# Patient Record
Sex: Female | Born: 1950
Health system: Southern US, Community
[De-identification: ages and names within clinical notes are randomized; demographics above are authoritative.]

## PROBLEM LIST (undated history)

## (undated) DIAGNOSIS — F0390 Unspecified dementia without behavioral disturbance: Secondary | ICD-10-CM

## (undated) DIAGNOSIS — E7800 Pure hypercholesterolemia, unspecified: Secondary | ICD-10-CM

## (undated) DIAGNOSIS — M62838 Other muscle spasm: Secondary | ICD-10-CM

## (undated) DIAGNOSIS — R001 Bradycardia, unspecified: Secondary | ICD-10-CM

## (undated) DIAGNOSIS — F988 Other specified behavioral and emotional disorders with onset usually occurring in childhood and adolescence: Secondary | ICD-10-CM

## (undated) DIAGNOSIS — R413 Other amnesia: Secondary | ICD-10-CM

## (undated) DIAGNOSIS — F324 Major depressive disorder, single episode, in partial remission: Secondary | ICD-10-CM

## (undated) HISTORY — DX: Pure hypercholesterolemia, unspecified: E78.00

## (undated) HISTORY — PX: SHOULDER ARTHROSCOPY: SHX128

## (undated) HISTORY — DX: Major depressive disorder, single episode, in partial remission: F32.4

## (undated) HISTORY — DX: Other amnesia: R41.3

## (undated) HISTORY — PX: CHOLECYSTECTOMY: SHX55

## (undated) HISTORY — PX: FOOT SURGERY: SHX648

## (undated) HISTORY — DX: Other specified behavioral and emotional disorders with onset usually occurring in childhood and adolescence: F98.8

## (undated) HISTORY — DX: Other muscle spasm: M62.838

---

## 1998-02-22 ENCOUNTER — Ambulatory Visit (HOSPITAL_COMMUNITY): Admission: RE | Admit: 1998-02-22 | Discharge: 1998-02-22 | Payer: Self-pay | Admitting: Gastroenterology

## 1998-02-22 ENCOUNTER — Encounter: Payer: Self-pay | Admitting: Gastroenterology

## 1998-02-28 ENCOUNTER — Encounter (HOSPITAL_BASED_OUTPATIENT_CLINIC_OR_DEPARTMENT_OTHER): Payer: Self-pay | Admitting: General Surgery

## 1998-03-01 ENCOUNTER — Encounter (HOSPITAL_BASED_OUTPATIENT_CLINIC_OR_DEPARTMENT_OTHER): Payer: Self-pay | Admitting: General Surgery

## 1998-03-01 ENCOUNTER — Ambulatory Visit (HOSPITAL_COMMUNITY): Admission: RE | Admit: 1998-03-01 | Discharge: 1998-03-02 | Payer: Self-pay | Admitting: General Surgery

## 1998-09-04 ENCOUNTER — Other Ambulatory Visit: Admission: RE | Admit: 1998-09-04 | Discharge: 1998-09-04 | Payer: Self-pay | Admitting: Obstetrics and Gynecology

## 1998-10-16 ENCOUNTER — Other Ambulatory Visit: Admission: RE | Admit: 1998-10-16 | Discharge: 1998-10-16 | Payer: Self-pay | Admitting: Obstetrics and Gynecology

## 1999-01-21 ENCOUNTER — Other Ambulatory Visit: Admission: RE | Admit: 1999-01-21 | Discharge: 1999-01-21 | Payer: Self-pay | Admitting: Obstetrics and Gynecology

## 1999-07-30 ENCOUNTER — Ambulatory Visit (HOSPITAL_BASED_OUTPATIENT_CLINIC_OR_DEPARTMENT_OTHER): Admission: RE | Admit: 1999-07-30 | Discharge: 1999-07-30 | Payer: Self-pay | Admitting: Orthopedic Surgery

## 2000-03-31 ENCOUNTER — Ambulatory Visit (HOSPITAL_COMMUNITY): Admission: RE | Admit: 2000-03-31 | Discharge: 2000-03-31 | Payer: Self-pay | Admitting: Obstetrics and Gynecology

## 2000-03-31 ENCOUNTER — Encounter: Payer: Self-pay | Admitting: Obstetrics and Gynecology

## 2000-08-26 ENCOUNTER — Other Ambulatory Visit: Admission: RE | Admit: 2000-08-26 | Discharge: 2000-08-26 | Payer: Self-pay | Admitting: Obstetrics and Gynecology

## 2001-10-18 ENCOUNTER — Encounter: Payer: Self-pay | Admitting: Otolaryngology

## 2001-10-18 ENCOUNTER — Encounter: Admission: RE | Admit: 2001-10-18 | Discharge: 2001-10-18 | Payer: Self-pay | Admitting: Otolaryngology

## 2001-10-28 ENCOUNTER — Other Ambulatory Visit: Admission: RE | Admit: 2001-10-28 | Discharge: 2001-10-28 | Payer: Self-pay | Admitting: Obstetrics and Gynecology

## 2002-05-15 ENCOUNTER — Ambulatory Visit (HOSPITAL_COMMUNITY): Admission: RE | Admit: 2002-05-15 | Discharge: 2002-05-15 | Payer: Self-pay | Admitting: Obstetrics and Gynecology

## 2002-05-15 ENCOUNTER — Encounter: Payer: Self-pay | Admitting: Obstetrics and Gynecology

## 2002-09-26 ENCOUNTER — Ambulatory Visit (HOSPITAL_COMMUNITY): Admission: RE | Admit: 2002-09-26 | Discharge: 2002-09-26 | Payer: Self-pay | Admitting: Gastroenterology

## 2003-06-21 ENCOUNTER — Other Ambulatory Visit: Admission: RE | Admit: 2003-06-21 | Discharge: 2003-06-21 | Payer: Self-pay | Admitting: Obstetrics and Gynecology

## 2004-06-25 ENCOUNTER — Other Ambulatory Visit: Admission: RE | Admit: 2004-06-25 | Discharge: 2004-06-25 | Payer: Self-pay | Admitting: Obstetrics and Gynecology

## 2006-07-22 ENCOUNTER — Encounter: Admission: RE | Admit: 2006-07-22 | Discharge: 2006-07-22 | Payer: Self-pay | Admitting: Gastroenterology

## 2007-10-12 ENCOUNTER — Ambulatory Visit (HOSPITAL_COMMUNITY): Admission: RE | Admit: 2007-10-12 | Discharge: 2007-10-12 | Payer: Self-pay | Admitting: Obstetrics and Gynecology

## 2007-10-27 ENCOUNTER — Other Ambulatory Visit: Admission: RE | Admit: 2007-10-27 | Discharge: 2007-10-27 | Payer: Self-pay | Admitting: Obstetrics and Gynecology

## 2008-10-16 ENCOUNTER — Ambulatory Visit (HOSPITAL_COMMUNITY): Admission: RE | Admit: 2008-10-16 | Discharge: 2008-10-16 | Payer: Self-pay | Admitting: Obstetrics and Gynecology

## 2008-10-18 ENCOUNTER — Other Ambulatory Visit: Admission: RE | Admit: 2008-10-18 | Discharge: 2008-10-18 | Payer: Self-pay | Admitting: Obstetrics and Gynecology

## 2008-11-19 ENCOUNTER — Encounter: Admission: RE | Admit: 2008-11-19 | Discharge: 2008-11-19 | Payer: Self-pay | Admitting: Chiropractor

## 2010-09-19 NOTE — Op Note (Signed)
[place]  Patient:    Megan Schultz, Megan Schultz                     MRN: [id number] Proc. Date: 07/30/99 Adm. Date:  [id number] Attending:  Milly Jakob CC:         Harvie Junior, M.D.                           Operative Report  PREOPERATIVE DIAGNOSIS:  Hallux rigidus with suspected osteocartilaginous injury to the great metatarsal.  POSTOPERATIVE DIAGNOSIS:  Hallux rigidus with suspected osteocartilaginous injury to the great metatarsal.  PROCEDURES: 1. Debridement of dorsal hallux rigidus. 2. Removal of medial eminence. 3. Debridement of osteochondral defect with drilling of the defect.  SURGEON:  Harvie Junior, M.D.  ASSISTANT:  None.  BRIEF HISTORY:  She is a 60 year old female with a long history of having had great toe pain.  It has just gotten worse and worse over time.  She was evaluated and had an injection.  Injection relieved her pain completely for several month period f time, the pain came back and because of continued complaints of pain, she was evaluated again.  At that time, she was noted to have some spurring dorsally, some limitations with dorsiflexion and because of continued complaints of pain related to this, she was ultimately brought to the operating room for evaluation procedure and debridement as needed.  PROCEDURE:  Patient taken to the operating room and after adequate anesthesia was obtained with general anesthetic, the patient placed supine on the operating table. The right leg was then prepped and draped in usual sterile fashion.  Following this, a midline incision was made dorsally over the great toe.  Subcutaneous tissues taken down to the level of the extensor tendon.  It was carefully retracted laterally and the metatarsal phalangeal joint was opened, flaps were raised and the articular cartilage was inspected on the metatarsal head.  There was an obvious  full-thickness defect at the sulcus and  it was a little unclear exactly what was the best course of action here.  Given that she did have a fairly prominent medial eminence, the articular cartilage of the sulcus was removed in line with the great metatarsal, essentially a Silver bunionectomy was performed.  Following this, attention was turned to look at the remainder of the great metatarsal head. There was an obvious 3 x 7 mm area of defect starting inferomedially and extending directly inferiorly on the metatarsal head.  There was a flap of cartilage at the superior aspect of this.  This was debrided and once this was debrided, the bony defect was drilled with a 4.5 K-wire.  Marrow elements were seen to escape from the drilled surface.  Following this, the dorsal ridge was removed.  Essentially, initially, a minimal amount.  Thoughts were given that as the toe plantar flexed, that it would be impacting this defect and so a more generous dorsal ______ was  performed with a saw taking about 20% of the metatarsal head.  Following this, he roughened edges were rasped down to smooth and rounded on the medial side.  The  wound was then copiously irrigated and suctioned dry.  The dorsal portion of the wound was then closed with a 3-0 Vicryl running suture.  The accessory tendon was allowed to fall back in place.  The skin was then closed with a 4-0 nylon running suture.  The Marcaine was instilled in the wound for postoperative anesthesia. A sterile compressive dressing was then applied and the patient was taken to the recovery room, where she was noted to be in satisfactory condition.  Estimated blood loss for the procedure was none. DD:  07/30/99 TD:  07/30/99 Job: [id number]

## 2010-09-19 NOTE — Op Note (Signed)
   NAME:  HAILLY, FESS                        ACCOUNT NO.:  [id number]   MEDICAL RECORD NO.:  [id number]                   PATIENT TYPE:  AMB   LOCATION:  ENDO                                 FACILITY:  [facility]   PHYSICIAN:  James L. Malon Kindle., M.D.          DATE OF BIRTH:  [birth year]   DATE OF PROCEDURE:  09/26/2002  DATE OF DISCHARGE:                                 OPERATIVE REPORT   PROCEDURE:  Colonoscopy.   MEDICATIONS:  1. Fentanyl 125 micrograms.  2. Versed 10 mg IV.   INDICATIONS FOR PROCEDURE:  Colon cancer screening.   DESCRIPTION OF PROCEDURE:  The procedure had been explained to the patient  and consent was obtained. The patient was placed in the left lateral  decubitus position. The Olympus videoscope was inserted and advanced. The  prep was quite good. The patient had a very long tortuous colon. Multiple  maneuvers including abdominal  pressure and position  changes were required.  Finally using abdominal pressure and the patient in the right lateral  decubitus position, we were able to advance down into the cecum.   The ileocecal valve and appendiceal orifice were seen. The scope was  withdrawn and the cecum, ascending colon, hepatic flexure, transverse,  descending and sigmoid colon were seen well. There was no significant  diverticular disease. No polyps were seen throughout. The scope was  withdrawn to the rectum. The rectum was free of polyps.   The patient tolerated the procedure well. He was resting comfortably at the  termination of the procedure.    ASSESSMENT:  Essentially normal colonoscopy.   PLAN:  Recommend yearly Hemoccults and question repeat colonoscopy in 10  years.                                               James L. Malon Kindle., M.D.    Waldron Session  D:  09/26/2002  T:  09/26/2002  Job:  [id number]   cc:   Artist Pais, M.D.  [address], [address]  [place]  [place] [address]  Fax: [phone]   C. Duane Lope, M.D.  [address]  [place]  [place] [address]  Fax: [phone]

## 2011-10-20 ENCOUNTER — Other Ambulatory Visit (HOSPITAL_COMMUNITY): Payer: Self-pay | Admitting: Obstetrics and Gynecology

## 2011-10-20 DIAGNOSIS — Z1231 Encounter for screening mammogram for malignant neoplasm of breast: Secondary | ICD-10-CM

## 2011-11-11 ENCOUNTER — Ambulatory Visit (HOSPITAL_COMMUNITY): Payer: Self-pay

## 2011-11-24 ENCOUNTER — Ambulatory Visit (HOSPITAL_COMMUNITY)
Admission: RE | Admit: 2011-11-24 | Discharge: 2011-11-24 | Disposition: A | Discharge status: Home / Self Care | Payer: BC Managed Care – PPO | Source: Ambulatory Visit | Attending: Obstetrics and Gynecology | Admitting: Obstetrics and Gynecology

## 2011-11-24 DIAGNOSIS — Z1231 Encounter for screening mammogram for malignant neoplasm of breast: Secondary | ICD-10-CM

## 2016-10-01 ENCOUNTER — Other Ambulatory Visit: Payer: Self-pay | Admitting: Family Medicine

## 2016-10-01 DIAGNOSIS — Z1231 Encounter for screening mammogram for malignant neoplasm of breast: Secondary | ICD-10-CM

## 2016-10-01 DIAGNOSIS — M8580 Other specified disorders of bone density and structure, unspecified site: Secondary | ICD-10-CM

## 2016-10-29 ENCOUNTER — Ambulatory Visit: Payer: Self-pay

## 2016-10-29 ENCOUNTER — Other Ambulatory Visit: Payer: Self-pay

## 2017-11-03 ENCOUNTER — Other Ambulatory Visit: Payer: Self-pay | Admitting: Family Medicine

## 2017-11-03 DIAGNOSIS — Z1231 Encounter for screening mammogram for malignant neoplasm of breast: Secondary | ICD-10-CM

## 2018-01-06 ENCOUNTER — Ambulatory Visit: Payer: Self-pay

## 2018-03-30 ENCOUNTER — Ambulatory Visit
Admission: RE | Admit: 2018-03-30 | Discharge: 2018-03-30 | Disposition: A | Discharge status: Home / Self Care | Payer: Medicare Other | Source: Ambulatory Visit | Attending: Family Medicine | Admitting: Family Medicine

## 2018-03-30 DIAGNOSIS — Z1231 Encounter for screening mammogram for malignant neoplasm of breast: Secondary | ICD-10-CM

## 2019-08-14 ENCOUNTER — Ambulatory Visit: Payer: Medicare Other | Attending: Internal Medicine

## 2019-08-14 DIAGNOSIS — Z23 Encounter for immunization: Secondary | ICD-10-CM

## 2019-08-14 NOTE — Progress Notes (Signed)
   Covid-19 Vaccination Clinic  Name:  Megan Schultz    MRN: [id number] DOB: [birth year]  08/14/2019  [name] was observed post Covid-19 immunization for 15 minutes without incident. She was provided with Vaccine Information Sheet and instruction to access the V-Safe system.   [name] was instructed to call 911 with any severe reactions post vaccine: Marland Kitchen Difficulty breathing  . Swelling of face and throat  . A fast heartbeat  . A bad rash all over body  . Dizziness and weakness   Immunizations Administered    Name Date Dose VIS Date Route   Pfizer COVID-19 Vaccine 08/14/2019 12:09 PM 0.3 mL 04/14/2019 Intramuscular   Manufacturer: [employer], [employer]   Lot: [id number]   NDC: [address]-9

## 2019-09-04 ENCOUNTER — Ambulatory Visit: Payer: Medicare Other | Attending: Internal Medicine

## 2019-09-04 DIAGNOSIS — Z23 Encounter for immunization: Secondary | ICD-10-CM

## 2019-09-04 NOTE — Progress Notes (Signed)
   Covid-19 Vaccination Clinic  Name:  Megan Schultz    MRN: [id number] DOB: Feb 11, 1951  09/04/2019  [name] was observed post Covid-19 immunization for 15 minutes without incident. She was provided with Vaccine Information Sheet and instruction to access the V-Safe system.   [name] was instructed to call 911 with any severe reactions post vaccine: Marland Kitchen Difficulty breathing  . Swelling of face and throat  . A fast heartbeat  . A bad rash all over body  . Dizziness and weakness   Immunizations Administered    Name Date Dose VIS Date Route   Pfizer COVID-19 Vaccine 09/04/2019  1:43 PM 0.3 mL 06/28/2018 Intramuscular   Manufacturer: [employer], [employer]   Lot: [id number]   NDC: [address]-2

## 2019-12-14 ENCOUNTER — Other Ambulatory Visit: Payer: Self-pay | Admitting: Family Medicine

## 2019-12-14 DIAGNOSIS — Z1231 Encounter for screening mammogram for malignant neoplasm of breast: Secondary | ICD-10-CM

## 2019-12-15 ENCOUNTER — Other Ambulatory Visit: Payer: Self-pay

## 2019-12-15 ENCOUNTER — Ambulatory Visit
Admission: RE | Admit: 2019-12-15 | Discharge: 2019-12-15 | Disposition: A | Discharge status: Home / Self Care | Payer: Medicare Other | Source: Ambulatory Visit | Attending: Family Medicine | Admitting: Family Medicine

## 2019-12-15 DIAGNOSIS — Z1231 Encounter for screening mammogram for malignant neoplasm of breast: Secondary | ICD-10-CM

## 2020-05-14 ENCOUNTER — Encounter: Payer: Self-pay | Admitting: *Deleted

## 2020-05-14 ENCOUNTER — Other Ambulatory Visit: Payer: Self-pay

## 2020-05-14 ENCOUNTER — Other Ambulatory Visit: Payer: Self-pay | Admitting: *Deleted

## 2020-05-14 ENCOUNTER — Ambulatory Visit: Payer: Medicare Other | Admitting: Diagnostic Neuroimaging

## 2020-05-14 VITALS — BP 142/90 | HR 63 | Ht 64.0 in | Wt 163.4 lb

## 2020-05-14 DIAGNOSIS — R413 Other amnesia: Secondary | ICD-10-CM | POA: Diagnosis not present

## 2020-05-14 NOTE — Progress Notes (Signed)
[facility]  PATIENT: Megan Schultz DOB: [birth year]  REFERRING CLINICIAN: [name], MD HISTORY FROM: patient  REASON FOR VISIT: new consult    HISTORICAL  CHIEF COMPLAINT:  Chief Complaint  Patient presents with  . Memory Loss    Rm 6 New Pt  sonRonaldo Miyamoto  MMSE 28    HISTORY OF PRESENT ILLNESS:   70 year old female here for evaluation of memory loss.  Patient has history of ADHD, was on medication from around 1994 until 2017.  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  She retired in 2006.  The past 1 to 2 years she has noticed more short-term memory loss and cognitive difficulties.  This is been noticed by patient and family.  She still able to maintain most of her ADLs and functioning independently.  She took care of her mother who passed away from dementia, and she is concerned about similar issue for herself.   REVIEW OF SYSTEMS: Full 14 system review of systems performed and negative with exception of: As per HPI.  ALLERGIES: Allergies  Allergen Reactions  . Amoxicillin Itching    HOME MEDICATIONS: Outpatient Medications Prior to Visit  Medication Sig Dispense Refill  . citalopram (CELEXA) 20 MG tablet Take 20-30 mg by mouth daily.    Marland Kitchen tiZANidine (ZANAFLEX) 2 MG tablet Take by mouth.     No facility-administered medications prior to visit.    PAST MEDICAL HISTORY: Past Medical History:  Diagnosis Date  . ADD (attention deficit disorder)   . High cholesterol   . Major depression in partial remission (HCC)   . Memory changes   . Muscle spasm     PAST SURGICAL HISTORY: Past Surgical History:  Procedure Laterality Date  . CHOLECYSTECTOMY    . FOOT SURGERY Right   . SHOULDER ARTHROSCOPY      FAMILY HISTORY: Family History  Problem Relation Age of Onset  . Dementia Mother   . Cancer Father        gall bladder  . Other Maternal Grandmother        syringomyelia  . Alcoholism Paternal Grandfather   . Breast  cancer Neg Hx     SOCIAL HISTORY: Social History   Socioeconomic History  . Marital status: Married    Spouse name: Nicholes Mango  . Number of children: 3  . Years of education: Diploma RN  . Highest education level: Associate degree: academic program  Occupational History    Comment: retired Charity fundraiser   Tobacco Use  . Smoking status: Never Smoker  . Smokeless tobacco: Never Used  Substance and Sexual Activity  . Alcohol use: Not Currently  . Drug use: Never  . Sexual activity: Not on file  Other Topics Concern  . Not on file  Social History Narrative   05/14/20 lives with spouse   Social Determinants of Health   Financial Resource Strain: Not on file  Food Insecurity: Not on file  Transportation Needs: Not on file  Physical Activity: Not on file  Stress: Not on file  Social Connections: Not on file  Intimate Partner Violence: Not on file     PHYSICAL EXAM  GENERAL EXAM/CONSTITUTIONAL: Vitals:  Vitals:   05/14/20 1506  BP: (!) 142/90  Pulse: 63  Weight: 163 lb 6.4 oz (74.1 kg)  Height: 5\' 4"  (1.626 m)     Body mass index is 28.05 kg/m. Wt Readings from Last 3 Encounters:  05/14/20 163 lb 6.4 oz (74.1 kg)  Patient is in no distress; well developed, nourished and groomed; neck is supple  CARDIOVASCULAR:  Examination of carotid arteries is normal; no carotid bruits  Regular rate and rhythm, no murmurs  Examination of peripheral vascular system by observation and palpation is normal  EYES:  Ophthalmoscopic exam of optic discs and posterior segments is normal; no papilledema or hemorrhages  No exam data present  MUSCULOSKELETAL:  Gait, strength, tone, movements noted in Neurologic exam below  NEUROLOGIC: MENTAL STATUS:  MMSE - Mini Mental State Exam 05/14/2020  Orientation to time 5  Orientation to Place 5  Registration 3  Attention/ Calculation 3  Recall 3  Language- name 2 objects 2  Language- repeat 1  Language- follow 3 step command 3   Language- read & follow direction 1  Write a sentence 1  Copy design 1  Total score 28    awake, alert, oriented to person, place and time  recent and remote memory intact  normal attention and concentration  language fluent, comprehension intact, naming intact  fund of knowledge appropriate  CRANIAL NERVE:   2nd - no papilledema on fundoscopic exam  2nd, 3rd, 4th, 6th - pupils equal and reactive to light, visual fields full to confrontation, extraocular muscles intact, no nystagmus  5th - facial sensation symmetric  7th - facial strength symmetric  8th - hearing intact  9th - palate elevates symmetrically, uvula midline  11th - shoulder shrug symmetric  12th - tongue protrusion midline  MOTOR:   normal bulk and tone, full strength in the BUE, BLE  SENSORY:   normal and symmetric to light touch, temperature, vibration  COORDINATION:   finger-nose-finger, fine finger movements normal  REFLEXES:   deep tendon reflexes present and symmetric  GAIT/STATION:   narrow based gait     DIAGNOSTIC DATA (LABS, IMAGING, TESTING) - I reviewed patient records, labs, notes, testing and imaging myself where available.  No results found for: WBC, HGB, HCT, MCV, PLT No results found for: NA, K, CL, CO2, GLUCOSE, BUN, CREATININE, CALCIUM, PROT, ALBUMIN, AST, ALT, ALKPHOS, BILITOT, GFRNONAA, GFRAA No results found for: CHOL, HDL, LDLCALC, LDLDIRECT, TRIG, CHOLHDL No results found for: OQHU7M No results found for: VITAMINB12 No results found for: TSH        ASSESSMENT AND PLAN  70 y.o. year old female here with short-term memory loss, cognitive decline, history of ADHD, history of anxiety depression, with no major changes in ADLs.  Suspect mild cognitive impairment versus sequelae of ADHD, anxiety and depression.  Dx:  1. Memory loss       PLAN:  MILD COGNITIVE IMPAIRMENT (MMSE 28/30; no major changes in ADLs) - check MRI brain, labs - safety /  supervision issues reviewed - daily physical activity / exercise (at least 15-30 minutes) - eat more plants / vegetables - increase social activities, brain stimulation, games, puzzles, hobbies, crafts, arts, music - aim for at least 7-8 hours sleep per night (or more) - avoid smoking and alcohol - caregiver resources provided - caution with medications, finances, driving  Orders Placed This Encounter  Procedures  . MR BRAIN W WO CONTRAST  . Vitamin B12  . TSH   Return for pending if symptoms worsen or fail to improve, return to PCP.    Suanne Marker, MD 05/14/2020, 3:44 PM Certified in Neurology, Neurophysiology and Neuroimaging  [facility] [address], [address] [place], [place] [address] [phone]

## 2020-05-14 NOTE — Patient Instructions (Signed)
MILD COGNITIVE IMPAIRMENT  - check MRI brain, labs - safety / supervision issues reviewed - daily physical activity / exercise (at least 15-30 minutes) - eat more plants / vegetables - increase social activities, brain stimulation, games, puzzles, hobbies, crafts, arts, music - aim for at least 7-8 hours sleep per night (or more) - avoid smoking and alcohol - caregiver resources provided - caution with medications, finances, driving

## 2020-05-15 ENCOUNTER — Telehealth: Payer: Self-pay | Admitting: Diagnostic Neuroimaging

## 2020-05-15 ENCOUNTER — Telehealth: Payer: Self-pay | Admitting: *Deleted

## 2020-05-15 LAB — TSH: TSH: 2.09 u[IU]/mL (ref 0.450–4.500)

## 2020-05-15 LAB — VITAMIN B12: Vitamin B-12: 653 pg/mL (ref 232–1245)

## 2020-05-15 NOTE — Telephone Encounter (Signed)
LVM informing patient her labs are normal. Advised she may call [facility] to schedule MRI if she hasn't heard from them. Gave her their #, office # for questions.

## 2020-05-15 NOTE — Telephone Encounter (Signed)
[employer] medicare order sent to [facility]. No auth they will reach out to the patient to schedule.  

## 2020-05-27 ENCOUNTER — Other Ambulatory Visit: Payer: Medicare Other

## 2020-06-20 ENCOUNTER — Other Ambulatory Visit: Payer: Self-pay

## 2020-06-20 ENCOUNTER — Ambulatory Visit
Admission: RE | Admit: 2020-06-20 | Discharge: 2020-06-20 | Disposition: A | Discharge status: Home / Self Care | Payer: Medicare Other | Source: Ambulatory Visit | Attending: Diagnostic Neuroimaging | Admitting: Diagnostic Neuroimaging

## 2020-06-20 DIAGNOSIS — G319 Degenerative disease of nervous system, unspecified: Secondary | ICD-10-CM | POA: Diagnosis not present

## 2020-06-20 DIAGNOSIS — R413 Other amnesia: Secondary | ICD-10-CM

## 2020-06-20 DIAGNOSIS — G238 Other specified degenerative diseases of basal ganglia: Secondary | ICD-10-CM | POA: Diagnosis not present

## 2020-06-20 MED ORDER — GADOBENATE DIMEGLUMINE 529 MG/ML IV SOLN
15.0000 mL | Freq: Once | INTRAVENOUS | Status: AC | PRN
  Administered 2020-06-20: 15 mL via INTRAVENOUS

## 2020-07-01 ENCOUNTER — Telehealth: Payer: Self-pay | Admitting: *Deleted

## 2020-07-01 NOTE — Telephone Encounter (Signed)
LVM requesting call back for MRI results. 

## 2020-07-01 NOTE — Telephone Encounter (Signed)
Patient called back and I informed her labs showed a mild abnormality that could be related to thiamine defiency. Recommend to check thiamine levels and replace as needed. Advised she should take generali multi-vitamin for now. She will call PCP to get thiamine level checked. Patient verbalized understanding, appreciation.

## 2020-07-03 ENCOUNTER — Other Ambulatory Visit: Payer: Self-pay | Admitting: *Deleted

## 2020-07-03 ENCOUNTER — Other Ambulatory Visit (INDEPENDENT_AMBULATORY_CARE_PROVIDER_SITE_OTHER): Payer: Self-pay

## 2020-07-03 DIAGNOSIS — R413 Other amnesia: Secondary | ICD-10-CM

## 2020-07-03 DIAGNOSIS — Z0289 Encounter for other administrative examinations: Secondary | ICD-10-CM

## 2020-07-03 NOTE — Addendum Note (Signed)
Addended by: Tamera Stands D on: 07/03/2020 02:59 PM   Modules accepted: Orders

## 2020-07-11 ENCOUNTER — Telehealth: Payer: Self-pay | Admitting: *Deleted

## 2020-07-11 LAB — VITAMIN B1: Thiamine: 123.1 nmol/L (ref 66.5–200.0)

## 2020-07-11 NOTE — Telephone Encounter (Signed)
LVM informing patient her b1 lab is normal. Left # for questions.

## 2020-10-29 DIAGNOSIS — L255 Unspecified contact dermatitis due to plants, except food: Secondary | ICD-10-CM | POA: Diagnosis not present

## 2020-11-13 ENCOUNTER — Encounter: Payer: Self-pay | Admitting: Physician Assistant

## 2020-11-21 NOTE — Progress Notes (Signed)
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 MoCA today is 26/30 = 29/30 MMSE  with deficiencies only on delayed recall  2/5, orientation  5/6.    Recommendations:   Memory Loss   Neurocognitive testing to further evaluate cognitive concerns and determine underlying cause of memory changes, including potential contribution from sleep, anxiety, or depression  Check B12, TSH Discussed safety both in and out of the home.  Discussed the importance of regular daily schedule with inclusion of crossword puzzles to maintain brain function.  Continue to monitor mood with PCP with Celexa qhs Stay active at least 30 minutes at least 3 times a week.  Naps should be scheduled and should be no longer than 60 minutes and should not occur after 2 PM.  Mediterranean diet is recommended  Folllow up once results above are available   Subjective:    The patient is seen in neurologic consultation at the request of [name], MD for the evaluation of memory.  The patient is accompanied by son [name] who supplements the history. She is a 70 y.o. year old female who has had memory issues for about  3 years, Initially seen by Dr. [name] at [facility] on 05/14/20, diagnosed with MCI. MMSE  at the time was 28.  No medications were recommended at the time. Initially,her memory issues were noted by her son during the Summer of 2019 when she repeated the same conversation, not remembering she had talked about it before. She also asked similar questions. She adds"it's not uncommon for me to have 3 things in my mind, so I might forget I asked". She reports that the Covid pandemic has greatly affected her. Her relationship with her husband is not good, and has been worse over the last 2 years, with " possibly an infidelity  too"-son says. SHe does not do any activities without him, and other than enjoying her grandchildren and running with the other son daily, 1-2 miles a day, she is frequently bored. She cries often, afraid that she has dementia like her mother.  She has not yet seen a therapist or psychiatrist. Recently, the patient was started on Celexa, which does not seem to help, as the patient is taking it as needed rather than on a regimen.  She states that this makes her very sleepy, because she is taking it in the morning.  She sleeps about 8 hours, without vivid dreams or sleepwalking.  She denies hallucinations or paranoia.  She is independent of bathing and dressing.  Her husband has been always in charge of the finances, "is his thing".  She does not cook a lot, because her husband likes to go [employer] all the time, and her diet includes a lot of bread, and ice cream.  She has not been eating a healthy diet in years.  She does not take a lot of medications, but she is able to take it and not forget the doses.  She denies leaving objects in unusual places.  Her appetite is good, and denies any trouble swallowing.  The patient continues to drive, denies getting lost.  She denies any headaches, falls, injuries to the head except for a skiing accident 30 years ago, with neck pain x 5 years requiring a chiropractor, double vision, dizziness, focal numbness or tingling, unilateral  weakness or tremors.  Denies urine incontinence or retention, constipation or diarrhea.  Denies anosmia.  Denies a history of OSA, alcohol or tobacco.  As mentioned before, her family history is remarkable for dementia in her mother, whom she took care of until she died.  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    MRI brain with and without contrast 06/20/20 1. No acute intracranial abnormality. 2. Hyperintense T2-weighted signal within the superior cerebellar peduncles, posterior midbrain and left basal ganglia with mild superior cerebellar  atrophy. Findings suggest Wernicke encephalopathy.   Labs 05/14/20 Vit B12 05/04/20  653 TSH 2.090 B1 123.1    Allergies  Allergen Reactions   Amoxicillin Itching    Current Outpatient Medications  Medication Instructions   citalopram (CELEXA) 20-30 mg, Oral, Daily     VITALS:   Vitals:   11/22/20 1042  BP: (!) 142/83  Pulse: (!) 53  SpO2: 98%  Weight: 162 lb 9.6 oz (73.8 kg)  Height: 5' 4.5" (1.638 m)   No flowsheet data found.  HEENT:  Normocephalic, atraumatic. The mucous membranes are moist. The superficial temporal arteries are without ropiness or tenderness. Cardiovascular: Regular rate and rhythm. Lungs: Clear to auscultation bilaterally. Neck: There are no carotid bruits noted bilaterally.  NEUROLOGICAL: Montreal Cognitive Assessment  11/22/2020  Visuospatial/ Executive (0/5) 5  Naming (0/3) 3  Attention: Read list of digits (0/2) 2  Attention: Read list of letters (0/1) 1  Attention: Serial 7 subtraction starting at 100 (0/3) 3  Language: Repeat phrase (0/2) 2  Language : Fluency (0/1) 1  Abstraction (0/2) 2  Delayed Recall (0/5) 2  Orientation (0/6) 5  Total 26  Adjusted Score (based on education) 26   Orientation:  Alert and oriented to person, place and time. No aphasia or dysarthria. Fund of knowledge is appropriate. Recent memory and remote memory intact.  Attention may be reduced and concentration are normal.  Able to name objects and repeat phrases. Delayed recall  2/5 Cranial nerves: There is good facial symmetry. Extraocular muscles are intact and visual fields are full to confrontational testing. Speech is fluent and clear. Soft palate rises symmetrically and there is no tongue deviation. Hearing is intact to conversational tone. Tone: Tone is good throughout. Sensation: Sensation is intact to light touch and pinprick throughout. Vibration is intact at the bilateral big toe.There is no extinction with double simultaneous stimulation. There is no  sensory dermatomal level identified. Coordination: The patient has no difficulty with RAM's or FNF bilaterally. Normal finger to nose  Motor: Strength is 5/5 in the bilateral upper and lower extremities. There is no pronator drift. There are no fasciculations noted. DTR's: Deep tendon reflexes are 2/4 at the bilateral biceps, triceps, brachioradialis, patella and achilles.  Plantar responses are downgoing bilaterally. Gait and Station: The patient is able to ambulate without difficulty.The patient is able to heel toe walk without any difficulty.The patient is able to ambulate in a tandem fashion. The patient is able to stand in the Romberg position.   Thank you for allowing [place] the opportunity to participate in the care of this nice patient. Please do not hesitate to contact us for any questions or concerns.   Total time spent on today's visit was 60 minutes, including both face-to-face time and nonface-to-face time.  Time included that spent on review of records (prior notes available to me/labs/imaging if pertinent), discussing treatment and goals, answering patient's questions and coordinating care.  Cc:  [name], MD  [name] 11/22/2020 12:39 PM

## 2020-11-22 ENCOUNTER — Encounter: Payer: Self-pay | Admitting: Physician Assistant

## 2020-11-22 ENCOUNTER — Ambulatory Visit: Payer: Medicare Other | Admitting: Physician Assistant

## 2020-11-22 ENCOUNTER — Other Ambulatory Visit: Payer: Self-pay

## 2020-11-22 VITALS — BP 142/83 | HR 53 | Ht 64.5 in | Wt 162.6 lb

## 2020-11-22 DIAGNOSIS — R413 Other amnesia: Secondary | ICD-10-CM

## 2020-11-22 NOTE — Patient Instructions (Signed)
It was a pleasure to see you today at our office.   Recommendations:  Neurocognitive evaluation at our office Check B12, B1 and TSH at the lab Follow up once the results of the above are available   RECOMMENDATIONS FOR ALL PATIENTS WITH MEMORY PROBLEMS: 1. Continue to exercise (Recommend 30 minutes of walking everyday, or 3 hours every week) 2. Increase social interactions - continue going to [place] and enjoy social gatherings with friends and family 3. Eat healthy, avoid fried foods and eat more fruits and vegetables 4. Maintain adequate blood pressure, blood sugar, and blood cholesterol level. Reducing the risk of stroke and cardiovascular disease also helps promoting better memory. 5. Avoid stressful situations. Live a simple life and avoid aggravations. Organize your time and prepare for the next day in anticipation. 6. Sleep well, avoid any interruptions of sleep and avoid any distractions in the bedroom that may interfere with adequate sleep quality 7. Avoid sugar, avoid sweets as there is a strong link between excessive sugar intake, diabetes, and cognitive impairment We discussed the Mediterranean diet, which has been shown to help patients reduce the risk of progressive memory disorders and reduces cardiovascular risk. This includes eating fish, eat fruits and green leafy vegetables, nuts like almonds and hazelnuts, walnuts, and also use olive oil. Avoid fast foods and fried foods as much as possible. Avoid sweets and sugar as sugar use has been linked to worsening of memory function.  There is always a concern of gradual progression of memory problems. If this is the case, then we may need to adjust level of care according to patient needs. Support, both to the patient and caregiver, should then be put into place.      You have been referred for a neuropsychological evaluation (i.e., evaluation of memory and thinking abilities). Please bring someone with you to this appointment if  possible, as it is helpful for the doctor to hear from both you and another adult who knows you well. Please bring eyeglasses and hearing aids if you wear them.    The evaluation will take approximately 3 hours and has two parts:   The first part is a clinical interview with the neuropsychologist (Dr. Milbert Coulter or Dr. Roseanne Reno). During the interview, the neuropsychologist will speak with you and the individual you brought to the appointment.    The second part of the evaluation is testing with the doctor's technician Annabelle Harman or Selena Batten). During the testing, the technician will ask you to remember different types of material, solve problems, and answer some questionnaires. Your family member will not be present for this portion of the evaluation.   Please note: We must reserve several hours of the neuropsychologist's time and the psychometrician's time for your evaluation appointment. As such, there is a No-Show fee of $100. If you are unable to attend any of your appointments, please contact our office as soon as possible to reschedule.    FALL PRECAUTIONS: Be cautious when walking. Scan the area for obstacles that may increase the risk of trips and falls. When getting up in the mornings, sit up at the edge of the bed for a few minutes before getting out of bed. Consider elevating the bed at the head end to avoid drop of blood pressure when getting up. Walk always in a well-lit room (use night lights in the walls). Avoid area rugs or power cords from appliances in the middle of the walkways. Use a walker or a cane if necessary and consider physical therapy  for balance exercise. Get your eyesight checked regularly.  FINANCIAL OVERSIGHT: Supervision, especially oversight when making financial decisions or transactions is also recommended.  HOME SAFETY: Consider the safety of the kitchen when operating appliances like stoves, microwave oven, and blender. Consider having supervision and share cooking responsibilities  until no longer able to participate in those. Accidents with firearms and other hazards in the house should be identified and addressed as well.   ABILITY TO BE LEFT ALONE: If patient is unable to contact 911 operator, consider using LifeLine, or when the need is there, arrange for someone to stay with patients. Smoking is a fire hazard, consider supervision or cessation. Risk of wandering should be assessed by caregiver and if detected at any point, supervision and safe proof recommendations should be instituted.  MEDICATION SUPERVISION: Inability to self-administer medication needs to be constantly addressed. Implement a mechanism to ensure safe administration of the medications.   DRIVING: Regarding driving, in patients with progressive memory problems, driving will be impaired. We advise to have someone else do the driving if trouble finding directions or if minor accidents are reported. Independent driving assessment is available to determine safety of driving.   If you are interested in the driving assessment, you can contact the following:  [facility] in [place] [phone]  [facility] [phone]  [facility] [phone] or [phone]    Mediterranean Diet A Mediterranean diet refers to food and lifestyle choices that are based on the traditions of countries located on the [employer] [place]. This way of eating has been shown to help prevent certain conditions and improve outcomes for people who have chronic diseases, like kidney disease and heart disease. What are tips for following this plan? Lifestyle  Cook and eat meals together with your family, when possible. Drink enough fluid to keep your urine clear or pale yellow. Be physically active every day. This includes: Aerobic exercise like running or swimming. Leisure activities like gardening, walking, or housework. Get 7-8 hours of sleep each  night. If recommended by your health care provider, drink red wine in moderation. This means 1 glass a day for nonpregnant women and 2 glasses a day for men. A glass of wine equals 5 oz (150 mL). Reading food labels  Check the serving size of packaged foods. For foods such as rice and pasta, the serving size refers to the amount of cooked product, not dry. Check the total fat in packaged foods. Avoid foods that have saturated fat or trans fats. Check the ingredients list for added sugars, such as corn syrup. Shopping  At the grocery store, buy most of your food from the areas near the walls of the store. This includes: Fresh fruits and vegetables (produce). Grains, beans, nuts, and seeds. Some of these may be available in unpackaged forms or large amounts (in bulk). Fresh seafood. Poultry and eggs. Low-fat dairy products. Buy whole ingredients instead of prepackaged foods. Buy fresh fruits and vegetables in-season from local farmers markets. Buy frozen fruits and vegetables in resealable bags. If you do not have access to quality fresh seafood, buy precooked frozen shrimp or canned fish, such as tuna, salmon, or sardines. Buy small amounts of raw or cooked vegetables, salads, or olives from the deli or salad bar at your store. Stock your pantry so you always have certain foods on hand, such as olive oil, canned tuna, canned tomatoes, rice, pasta, and beans. Cooking  Cook foods with extra-virgin olive oil instead  of using butter or other vegetable oils. Have meat as a side dish, and have vegetables or grains as your main dish. This means having meat in small portions or adding small amounts of meat to foods like pasta or stew. Use beans or vegetables instead of meat in common dishes like chili or lasagna. Experiment with different cooking methods. Try roasting or broiling vegetables instead of steaming or sauteing them. Add frozen vegetables to soups, stews, pasta, or rice. Add nuts or seeds  for added healthy fat at each meal. You can add these to yogurt, salads, or vegetable dishes. Marinate fish or vegetables using olive oil, lemon juice, garlic, and fresh herbs. Meal planning  Plan to eat 1 vegetarian meal one day each week. Try to work up to 2 vegetarian meals, if possible. Eat seafood 2 or more times a week. Have healthy snacks readily available, such as: Vegetable sticks with hummus. Greek yogurt. Fruit and nut trail mix. Eat balanced meals throughout the week. This includes: Fruit: 2-3 servings a day Vegetables: 4-5 servings a day Low-fat dairy: 2 servings a day Fish, poultry, or lean meat: 1 serving a day Beans and legumes: 2 or more servings a week Nuts and seeds: 1-2 servings a day Whole grains: 6-8 servings a day Extra-virgin olive oil: 3-4 servings a day Limit red meat and sweets to only a few servings a month What are my food choices? Mediterranean diet Recommended Grains: Whole-grain pasta. Brown rice. Bulgar wheat. Polenta. Couscous. Whole-wheat bread. Orpah Cobb. Vegetables: Artichokes. Beets. Broccoli. Cabbage. Carrots. Eggplant. Green beans. Chard. Kale. Spinach. Onions. Leeks. Peas. Squash. Tomatoes. Peppers. Radishes. Fruits: Apples. Apricots. Avocado. Berries. Bananas. Cherries. Dates. Figs. Grapes. Lemons. Melon. Oranges. Peaches. Plums. Pomegranate. Meats and other protein foods: Beans. Almonds. Sunflower seeds. Pine nuts. Peanuts. Cod. Salmon. Scallops. Shrimp. Tuna. Tilapia. Clams. Oysters. Eggs. Dairy: Low-fat milk. Cheese. Greek yogurt. Beverages: Water. Red wine. Herbal tea. Fats and oils: Extra virgin olive oil. Avocado oil. Grape seed oil. Sweets and desserts: [place] yogurt with honey. Baked apples. Poached pears. Trail mix. Seasoning and other foods: Basil. Cilantro. Coriander. Cumin. Mint. Parsley. Sage. Rosemary. Tarragon. Garlic. Oregano. Thyme. Pepper. Balsalmic vinegar. Tahini. Hummus. Tomato sauce. Olives. Mushrooms. Limit  these Grains: Prepackaged pasta or rice dishes. Prepackaged cereal with added sugar. Vegetables: Deep fried potatoes (french fries). Fruits: Fruit canned in syrup. Meats and other protein foods: Beef. Pork. Lamb. Poultry with skin. Hot dogs. Tomasa Blase. Dairy: Ice cream. Sour cream. Whole milk. Beverages: Juice. Sugar-sweetened soft drinks. Beer. Liquor and spirits. Fats and oils: Butter. Canola oil. Vegetable oil. Beef fat (tallow). Lard. Sweets and desserts: Cookies. Cakes. Pies. Candy. Seasoning and other foods: Mayonnaise. Premade sauces and marinades. The items listed may not be a complete list. Talk with your dietitian about what dietary choices are right for you. Summary The Mediterranean diet includes both food and lifestyle choices. Eat a variety of fresh fruits and vegetables, beans, nuts, seeds, and whole grains. Limit the amount of red meat and sweets that you eat. Talk with your health care provider about whether it is safe for you to drink red wine in moderation. This means 1 glass a day for nonpregnant women and 2 glasses a day for men. A glass of wine equals 5 oz (150 mL). This information is not intended to replace advice given to you by your health care provider. Make sure you discuss any questions you have with your health care provider. Document Released: 12/12/2015 Document Revised: 01/14/2016 Document Reviewed: 12/12/2015 Elsevier Interactive  Patient Education  2017 [employer].

## 2020-11-25 ENCOUNTER — Other Ambulatory Visit (INDEPENDENT_AMBULATORY_CARE_PROVIDER_SITE_OTHER): Payer: Medicare Other

## 2020-11-25 ENCOUNTER — Other Ambulatory Visit: Payer: Self-pay

## 2020-11-25 DIAGNOSIS — R413 Other amnesia: Secondary | ICD-10-CM | POA: Diagnosis not present

## 2020-11-28 LAB — TSH: TSH: 1.53 u[IU]/mL (ref 0.450–4.500)

## 2020-11-28 LAB — VITAMIN B1: Thiamine: 202.4 nmol/L — ABNORMAL HIGH (ref 66.5–200.0)

## 2020-11-28 LAB — VITAMIN B12: Vitamin B-12: 902 pg/mL (ref 232–1245)

## 2020-11-28 NOTE — Progress Notes (Signed)
Patient advised of labs.

## 2021-01-07 DIAGNOSIS — R109 Unspecified abdominal pain: Secondary | ICD-10-CM | POA: Diagnosis not present

## 2021-01-07 DIAGNOSIS — R899 Unspecified abnormal finding in specimens from other organs, systems and tissues: Secondary | ICD-10-CM | POA: Diagnosis not present

## 2021-01-07 DIAGNOSIS — E7800 Pure hypercholesterolemia, unspecified: Secondary | ICD-10-CM | POA: Diagnosis not present

## 2021-01-07 DIAGNOSIS — R413 Other amnesia: Secondary | ICD-10-CM | POA: Diagnosis not present

## 2021-01-09 ENCOUNTER — Encounter: Payer: Self-pay | Admitting: Counselor

## 2021-01-09 ENCOUNTER — Other Ambulatory Visit: Payer: Self-pay

## 2021-01-09 ENCOUNTER — Ambulatory Visit (INDEPENDENT_AMBULATORY_CARE_PROVIDER_SITE_OTHER): Payer: Medicare Other | Admitting: Counselor

## 2021-01-09 ENCOUNTER — Ambulatory Visit: Payer: Medicare Other | Admitting: Psychology

## 2021-01-09 DIAGNOSIS — R9089 Other abnormal findings on diagnostic imaging of central nervous system: Secondary | ICD-10-CM | POA: Diagnosis not present

## 2021-01-09 DIAGNOSIS — R419 Unspecified symptoms and signs involving cognitive functions and awareness: Secondary | ICD-10-CM | POA: Diagnosis not present

## 2021-01-09 DIAGNOSIS — R413 Other amnesia: Secondary | ICD-10-CM

## 2021-01-09 DIAGNOSIS — F09 Unspecified mental disorder due to known physiological condition: Secondary | ICD-10-CM

## 2021-01-09 NOTE — Progress Notes (Signed)
[facility]  Patient Name: Megan Schultz MRN: [id number] Date of Birth: [birth year] Age: 70 y.o. Education: 15 years  Referral Circumstances and Background Information  Megan Schultz is a 70 y.o., right-hand dominant, married woman with a history of ADHD, HLD, and previous diagnosis of MCI rendered by Dr. [name] at [facility] when she first consulted for her memory problems in January, 2022. She recently consulted with Megan Schultz, with our outpatient neurology practice, who noted a MoCA of 26/30 (points off on delayed recall and one point on orientation) and referred her for neuropsychological evaluation. Review of the medical record shows that the patient started having memory issues in the Summer of 2019, about three years ago, when her son noticed her repeating conversations. Also sounds as though there some marital discord and affective symptomatology. The patient did have an MRI with abnormal areas of T2/FLAIR high signal in the superior cerebellar peduncles, posterior midbrain and left basal ganglia with mild superior cerebellar atrophy suggestive of Wernicke encephalopathy.    On interview, the patient reported that her memory and thinking problems have been going on for three years and her husband thinks it is maybe more over the past year but admits that "she would know better than I." The patient stated that she has always had issues where it takes her longer to recall things than others, since she was a child. The patient had a hard time identifying her early symptoms or what it was that made her think there were changes. Her husband has mainly noticed difficulties with forgetfulness, starting about a year ago she would repeat herself and forget things over the course of several hours and at present, it has worsened and she can forget things over the course of 15 minutes. She forgets things that her husband tells her and she forgets things that she  tells him. She is organized with appointments so she does not forget them. Her husband does think that she misplaces things more over the past year than she did previously. They describe the decline as gradual but notably worse over the past year. They deny word finding problems. It sounds like the patient is quite concerned, she helped her mother with dementia, which was "mind numbing" and very difficult and she worries she will put her family through the same situation. With respect to orientation, she does not lose the month or the year. They are denying noticeable difficulties with decision making or problem solving. With respect to mood, it sounds like she is quite upset about the changes. She worries a lot about people's perception of how she is doing. She denied feeling persistently depressed, however, and also denied that she is feeling very sad or that she thinks about this on a daily basis. Her husband feels like she has "come to terms" with it. She reported that she is sleeping well, about 8 hours. She has no dream enactment. The patient reported that her appetite is good and her weight is stable. She reported that her diet is fairly good, although she was vague about what she eats. I see previously the impression was that she was not eating well. Her husband thinks she eats adequately and says she eats 3 meals a day.   The patient is denying any changes with respect to walking, balance, tremors, dizziness, equilibrium, double vision, or vision in general. She has no gait problems. She is in fact running 1 mile and walking 2 miles three times a  week with her son over the past year and a half. She has no falls.   The patient and her husband are denying that there is anything that she used to do that she cannot do now, although she may be be less efficient and compensates, such as by using notes. She has always been fairly organized because she knows she needs to be by virtue of her concerns about ADHD.  Her husband manages money and always has. She reported that she is quite dependent on her GPS and would get lost without it, although she doesn't think that is much of a change. Her husband is not concerned about driving and there are no accidents. She does not cook regularly because her husband likes to move out. She reported that she used to read and does not now, but it is preference and not due to problems with remembering things when reading. She uses a Teaching laboratory technician, mostly for games, and has an Geophysical data processor. She denied any problems utilizing those. She remembers and takes her own medications. She is now taking her Celexa everyday, she had been taking it PRN previously, 1/2 tablet per day, and she thinks it is helping to some extent. She does do things in the yard and babysits her grandchildren.   Past Medical History and Review of Relevant Studies  There are no problems to display for this patient.  Review of Neuroimaging and Relevant Medical History: The patient denied any history of bariatric surgery, eating disorders, or prolonged fasting.   The patient's MRI of the brain was reviewed, and I do appreciate the areas of abnormal T2/FLAIR high signal in the SCPs, posterior midbrain, and left basal ganglia noted by radiology as well as the atrophy of the superior cerebellum. These lesions are more concerning for Wernicke's given their specific distribution than their signal characteristics, which are nonspecific. Some of the lesions such as those in the caudate could be caused by any number of different factors (as could some of the other hyperintensities although all these findings together are at least somewhat concerning). I do not appreciate any significant atrophy or involvement of the mamillary bodies on the patient's MPR sequences. Apart from this, there is a mild burden of leukoaraiosis around the frontal horns and there is a mild burden of nonspecific volume loss.   I see that she had an old MRI in  2003 due to anosmia and ageusia, none of the above findings were noted in the report but there are no actual images.   Current Outpatient Medications  Medication Sig Dispense Refill   citalopram (CELEXA) 20 MG tablet Take 20-30 mg by mouth daily.     No current facility-administered medications for this visit.   Family History  Problem Relation Age of Onset   Dementia Mother    Cancer Father        gall bladder   Other Maternal Grandmother        syringomyelia   Alcoholism Paternal Grandfather    Breast cancer Neg Hx    There is a family history of dementia. The patient's mother started with dementia in her 31s. It sounds like this left a large impact on the patient. There is no  family history of psychiatric illness. The patient reported that her fathers side of the family had some alcoholism, and apparently some female member of the family on that side killed himself after a series of hardships.   Psychosocial History  Developmental, Educational and Employment History: The patient is  from [place], she reported that she had a normal childhood. She was a good student who earned mainly A's and B's, although she was "never the top of the class." She was never held back and never failed anything. She earned an Therapist, sports at [employer] at [employer] and did well. She worked as an Warden/ranger for many years. 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 She reported that she was "feeling unsure of herself" and didn't want to put anybody at risk. It doesn't sound like this was clearly a result of thinking and memory problems.   Psychiatric History: The patient reported that she has a diagnosis of ADHD, which was rendered by a psychaitrist. She thinks she was about 21 or 37 when it was diagnosed. Her eldest son has a history of ADHD and after he was diagnosed, she wondered about herself. She stated that she has always been somewhat forgetful, she has always needed to write  things down, and she needs to organize things to stay on track. She has never been good at multitasking. She tried stimulant medication of some sort and reported that it did not work well, she is an anxious person, it made her more anxious, and she stopped taking it. The patient reported that her teachers did comment on behavior problems, she was very rambunctious as a child and she did get disciplined.   Substance Use History: The patient denied any alcohol use. She has never been much of a drinker. The patient does not smoke and does not use any illicit substances.   Relationship History and Living Cimcumstances: The patient and her husband have been married for nearly 43 years. They have three children and 10 grandchildren. Their two sons have noticed her problems.   Mental Status and Neuropsychological Test Findings  Sensorium/Arousal: The patient's level of arousal was awake and alert. Hearing and vision were adequate for testing purposes. Orientation: The patient was generally oriented.  Appearance: Dressed in appropriate, casual clothing with reasonable grooming and hygiene.  Behavior: Pleasant, appropriate, did present as quite hard on herself during the interview. During testing, the patient was having a hard time. She persisted but then eventually, became tearful and asked to discontinue. I processed that with her and we made the decision to stop testing. It is not clear that it would go any better another day, so we kept her follow up but did not reschedule the testing portion.  Speech/language: Speech was normal in rate, rhythm, volume and prosody Gait/Posture: Not formally examined, normal on exam with Megan Schultz. Movement: No movement abnormalities noted at the patient's previous appointment with Megan Schultz.  Social Comportment: Pleasant, appropriate Mood: The patient reported she is doing well but is quite concerned about the issues that bring her in Affect: Mood congruent, with  moments of tearfulness when discussing mother with dementia Thought process/content: Thought process was logical, linear, and goal-directed for the most part. Thought content was appropriate to the topics discussed.  Safety: No safety concerns identified at today's encounter.  Insight: Fair  TEST SCORES:    Note: This summary of test scores accompanies the interpretive report and should not be interpreted by unqualified individuals or in isolation without reference to the report. Test scores are relative to age, gender, and educational history as available and appropriate.   Cognitive Tests        "A" Random Letter Test Raw  Descriptor  Errors 1 Within Expectation      Wide Range Achievement Test: Standard/Scaled Score Percentile      Word Reading 116 86      Reynolds Intellectual Screening Test Standard/T-score Percentile      Guess What 46 34      Odd Item Out 42 21  RIST Index 92 30      Neuropsychological Assessment Battery (Attention Module, Form 1): Scaled/T-score Percentile      Digits Forward 40 16      Digits Backwards 37 10      Numbers & Letters A Speed 45 31      Numbers & Letters A Accuracy 28 1      Numbers & Letters A Efficiency 37 9      Numbers & Letters B Efficiency 33 4      Numbers & Letters C Efficiency 35 7      Numbers & Letters D Efficiency DC DC      Numbers & Letters D Disruption NA NA      Rating Scales        Quick Dementia Rating System Raw Score Descriptor      Sum of Boxes 1 MCI      Total Score 1.5 MCI   Performance on single word reading was high average. By contrast, her performance on the RIST index was at the low end of the average range, with comparable scores on the verbally and visually oriented sub tests. Performance on indicators of attention and processing efficiency was below expecetation, with low average digit repetition forward, digit repetition backward, and unusually low scores on most measures of processing efficiency involving  number and letter cancellation under time pressure. She discontinued testing on the last such indicator, which involves alternating attention between cancellation and calculation, which appeared to be emotionally upsetting to her. She persisted into the first trial of list learning and then got too upset to continue. Her husband characterized her as functioning at a mild cognitive impairment level, with only mild changes.   Test Procedures  Wide Range Achievement Test - 4             Word Reading Doy Mince Intellectual Screening Test A Random Letter Test Neuropsychological Assessment Battery  Numbers & Letters A - D Quick Dementia [place] was seen for a psychiatric diagnostic evaluation and neuropsychological testing. She is a pleasant, 70 year old, right-hand dominant married woman with a history of cognitive changes that were noticed by her sons about 3 years ago. Her husband noticed the changes about a year ago. Her issues are fairly isolated at this point in time and involve memory loss, repeating herself, and they think they have been progressing over the past year. She is doing well on the MoCA, however, with a 26/30. MRI has some findings concerning for Wernickes, but is not classic and may not fit well clinically as she has no known history of encephalopathy, no motor symptoms, and her difficulties seem to be progressively worsening over time. There is also no involvement of the mamillary bodies, periaqueductal grey, or thalamus. Some of the lesions identified (e.g., caudate and basal ganglia) are nonspecific and could be caused by any number of vascular, autoimmune, or other etiologies. She presented today for neuropsychological testing and we completed a very brief portion of the exam before she became upset, tearful, and we needed to stop. I met with her to process those emotions, she stated that she felt she  could not compose herself to do the testing. I  questioned if she would like to reschedule but we agreed after discussion that it is probably not in her best interest given the degree of distress.   Megan Schultz appears to be demonstrating a mild cognitive impairment level problem. While her cognitive abilities could not be thoroughly assessed due to limited tolerance of the testing procedures, she did score below expectation on the measures of attention and working memory that she was able to get through, which is enough for a diagnosis of MCI. Of course it is possible that these difficulties also reflect developmental factors given her history of ADHD but I am concerned they are a decline given the history she reports. Memory could not be fully assessed but her single trial learning was low.   Given her imaging, Wernicke's is certainly within the differential although she has no ophthalmologic or neurological findings on exam with Megan Schultz nor did she with Dr. [name] closer to the time that her MRI was obtained. She is also not a drinker, has no history of bariatric surgery, fasting, or other risk factors besides not eating a particularly healthy diet. Would recommend lifestyle changes at this point in time, including MIND diet, use of compensatory strategies and the like. She is now taking her antidepressant appropriately and consistently and that may help as well. She is already exercising, which I reinforced. Defer to her medical care providers regarding thiamine supplementation, although this would seem to be prudent given risk benefit analysis in the setting of possible Wernickes. MRI could also be repeated to make sure these findings are stable, preferably with thin cut T2 sequences (e.g., isotropic 1-1.70mm) for better characterization of her lesions in all planes. Viviano Simas Nicole Kindred, PsyD, [place] Clinical Neuropsychologist  Informed Consent  Risks and benefits of the evaluation were discussed with the patient prior to all testing  procedures. I conducted a clinical interview   with Miguel Aschoff and Milana Kidney, B.S. (Technician) administered additional test procedures.   Services associated with this encounter: Clinical Interview [phone]) plus 60 minutes (96132/96133; Neuropsychological Evaluation by Professional)  55 minutes (96138/96139; Neuropsychological Testing by Technician)

## 2021-01-09 NOTE — Progress Notes (Signed)
   Psychometrist Note   Cognitive testing was administered to Megan Schultz by Milana Kidney, B.S. (Technician) under the supervision of Alphonzo Severance, Psy.D., ABN. [name] was able to tolerate all test procedures. Dr. Nicole Kindred met with the patient as needed to manage any emotional reactions to the testing procedures. Rest breaks were offered.    The battery of tests administered was selected by Dr. Nicole Kindred with consideration to the patient's current level of functioning, the nature of her symptoms, emotional and behavioral responses during the interview, level of literacy, observed level of motivation/effort, and the nature of the referral question. This battery was communicated to the psychometrist. Communication between Dr. Nicole Kindred and the psychometrist was ongoing throughout the evaluation and Dr. Nicole Kindred was immediately accessible at all times. Dr. Nicole Kindred provided supervision to the technician on the date of this service, to the extent necessary to assure the quality of all services provided.    [name] will return in approximately one week for an interactive feedback session with Dr. Nicole Kindred, at which time test performance, clinical impressions, and treatment recommendations will be reviewed in detail. The patient understands she can contact our office should she require our assistance before this time.   A total of 55 minutes of billable time were spent with Megan Schultz by the technician, including test administration and scoring time. Billing for these services is reflected in Dr. Les Pou note.   This note reflects time spent with the psychometrician and does not include test scores, clinical history, or any interpretations made by Dr. Nicole Kindred. The full report will follow in a separate note.

## 2021-01-16 ENCOUNTER — Encounter: Payer: Medicare Other | Admitting: Counselor

## 2021-01-30 ENCOUNTER — Encounter: Payer: Self-pay | Admitting: Counselor

## 2021-01-30 ENCOUNTER — Ambulatory Visit (INDEPENDENT_AMBULATORY_CARE_PROVIDER_SITE_OTHER): Payer: Medicare Other | Admitting: Counselor

## 2021-01-30 ENCOUNTER — Other Ambulatory Visit: Payer: Self-pay

## 2021-01-30 DIAGNOSIS — G3184 Mild cognitive impairment, so stated: Secondary | ICD-10-CM | POA: Diagnosis not present

## 2021-01-30 NOTE — Patient Instructions (Signed)
We discussed your presentation and performance. While you did not complete neuropsychological testing, your clinical history and the testing that you did do are enough for a diagnosis of Mild Cognitive Impairment (MCI).   The major difference between mild cognitive impairment (MCI) and dementia is in severity and potential prognosis. Once someone reaches a level of severity adequate to be diagnosed with a dementia, there is usually progression over time, though this may be years. On the other hand, mild cognitive impairment, while a significant risk for dementia in future, does not always progress to dementia, and in some instances stays the same or can even revert to normal. It is important to realize that if MCI is due to underlying Alzheimer's disease, it will most likely progress to dementia eventually. The rate of conversion to Alzheimer's dementia from amnestic MCI is about 15% per year versus the general population risk of conversion of 2% per year.   Of course it is possible that your mild cognitive impairment will turn out to be due to Alzheimer's disease. It is also possible that it is due to something else, however. We discussed Wernicke-Korsakoff syndrome and that it can look very much like Alzheimer's. We discussed the importance of thiamine supplemntation and good nutrition. We discussed your imaging and that it had findings concerning for possible Wernicke-Korsakoff syndrome.   There is now good quality evidence from at least one large scale study that a modified mediterranean diet may help slow cognitive decline. This is known as the "MIND" diet. The Mind diet is not so much a specific diet as it is a set of recommendations for things that you should and should not eat.   Foods that are ENCOURAGED on the MIND Diet:  Green, leafy vegetables: Aim for six or more servings per week. This includes kale, spinach, cooked greens and salads.  All other vegetables: Try to eat another vegetable in  addition to the green leafy vegetables at least once a day. It is best to choose non-starchy vegetables because they have a lot of nutrients with a low number of calories.  Berries: Eat berries at least twice a week. There is a plethora of research on strawberries, and other berries such as blueberries, raspberries and blackberries have also been found to have antioxidant and brain health benefits.  Nuts: Try to get five servings of nuts or more each week. The creators of the MIND diet don't specify what kind of nuts to consume, but it is probably best to vary the type of nuts you eat to obtain a variety of nutrients. Peanuts are a legume and do not fall into this category.  Olive oil: Use olive oil as your main cooking oil. There may be other heart-healthy alternatives such as algae oil, though there is not yet sufficient research upon which to base a formal recommendation.  Whole grains: Aim for at least three servings daily. Choose minimally processed grains like oatmeal, quinoa, brown rice, whole-wheat pasta and 100% whole-wheat bread.  Fish: Eat fish at least once a week. It is best to choose fatty fish like salmon, sardines, trout, tuna and mackerel for their high amounts of omega-3 fatty acids.  Beans: Include beans in at least four meals every week. This includes all beans, lentils and soybeans.  Poultry: Try to eat chicken or [place] at least twice a week. Note that fried chicken is not encouraged on the MIND diet.  Wine: Aim for no more than one glass of alcohol daily. Both red and  white wine may benefit the brain. However, much research has focused on the red wine compound resveratrol, which may help protect against Alzheimer's disease.  Foods that are DISCOURAGED on the MIND Diet: Butter and margarine: Try to eat less than 1 tablespoon (about 14 grams) daily. Instead, try using olive oil as your primary cooking fat, and dipping your bread in olive oil with herbs.  Cheese: The MIND diet  recommends limiting your cheese consumption to less than once per week.  Red meat: Aim for no more than three servings each week. This includes all beef, pork, lamb and products made from these meats.  [place] food: The MIND diet highly discourages fried food, especially the kind from fast-food restaurants. Limit your consumption to less than once per week.  Pastries and sweets: This includes most of the processed junk food and desserts you can think of. Ice cream, cookies, brownies, snack cakes, donuts, candy and more. Try to limit these to no more than four times a week.  The following compensatory strategies may be helpful for managing day-to-day memory symptoms: Minimize distractions and interruptions to the extent possible. Be an active observer, present-minded, and focus attention. Focus on only one task for a period of time.  Get organized. Establish routines and stick to them. Make and use checklists.  Use external memory aids as needed, such as a planner and notebook. Repetition, written reminders, and keeping a calendar of appointments may be helpful. Designate a place to keep your keys, wallet, cell phone, and other personal belongings.  Break down tasks into smaller steps to help get started and to keep from feeling overwhelmed.  Increase your success learning information by breaking it into manageable chunks, connecting it to previously learned information, or forming associations with what you are trying to remember.   Exercise is one of the best medicines for promoting health and maintaining cognitive fitness at all stages in life. Exercise probably has the largest documented effect on brain health and performance of any lifestyle intervention. Studies have shown that even previously sedentary individuals who start exercising as late as age 68 show a significant survival benefit as compared to their non-exercising peers. In the [place], the current guidelines are for 30 minutes of  moderate exercise per day, but increasing your activity level less than that may also be helpful. You do not have to get your 30 minutes of exercise in one shot and exercising for short periods of time spread throughout the day can be helpful. Go for several walks, learn to dance, or do something else you enjoy that gets your body moving. Of course, if you have an underlying medical condition or there is any question about whether it is safe for you to exercise, you should consult a medical treatment provider prior to beginning exercise.   Of course, if you have a change of heart and do wish to go through with the testing, we can do that in the future.

## 2021-01-30 NOTE — Progress Notes (Signed)
   NEUROPSYCHOLOGY FEEDBACK NOTE [facility]  Feedback Note: I met with STELLAROSE CERNY to review the findings resulting from her neuropsychological evaluation. Since the last appointment, she has been about the same. Time was spent reviewing the impressions and recommendations that are detailed in the evaluation report. We discussed impression of mild cognitive impairment, with memory loss based on clinical history, as reflected in the patient instructions. I discussed possible etiologies including AD and wernicke-korsakoff, and we also discussed primary prevention, including MIND diet, exercise (she is already doing some), and the like. We discussed the importance of not catastrophizing, and the difference between Alzheimer's disease, the clinical state of dementia, and mild cognitive impairment. I took time to explain the findings and answer all the patient's questions. I encouraged Ms. Mace to contact me should she have any further questions or if further follow up is desired.   Current Medications and Medical History   Current Outpatient Medications  Medication Sig Dispense Refill   citalopram (CELEXA) 20 MG tablet Take 20-30 mg by mouth daily.     No current facility-administered medications for this visit.    There are no problems to display for this patient.   Mental Status and Behavioral Observations  MIKELE SIFUENTES presented on time to the present encounter and was alert and generally oriented. Speech was normal in rate, rhythm, volume, and prosody. Self-reported mood was "ok" and affect was somewhat anxious, becoming more neutral as the encounter progressed.. Thought process was goal oriented and thought content was appropriate. There was some forgetting noted during the clinical encounter. There were no safety concerns identified at today's encounter, such as thoughts of harming self or others.   Plan  Feedback provided regarding the patient's neuropsychological  evaluation. She presented as empowered and stated that she felt better after the appointment. I was candid that this very well could be Alzheimer's, but that she is not demented based on their report of her functioning, and now is the time for primary prevention. ALEXIS REBER was encouraged to contact me if any questions arise or if further follow up is desired.   Viviano Simas Nicole Kindred, PsyD, ABN Clinical Neuropsychologist  Service(s) Provided at This Encounter: 42 minutes [phone]; Conjoint therapy with patient present)

## 2021-02-19 IMAGING — MR MR HEAD WO/W CM
14 series · 48 of 48 positions shown · IV contrast (multihance)
Comparison: None.

CLINICAL DATA: Memory loss

EXAM:
MRI HEAD WITHOUT AND WITH CONTRAST
TECHNIQUE: Multiplanar, multiecho pulse sequences of the brain and surrounding
structures were obtained without and with intravenous contrast.
CONTRAST:  15mL MULTIHANCE GADOBENATE DIMEGLUMINE 529 MG/ML IV SOLN

[Series 2: T1 · sagittal · 5.0mm · 0.45mm/px · 2 of 23 slices shown]
[im 1/23]
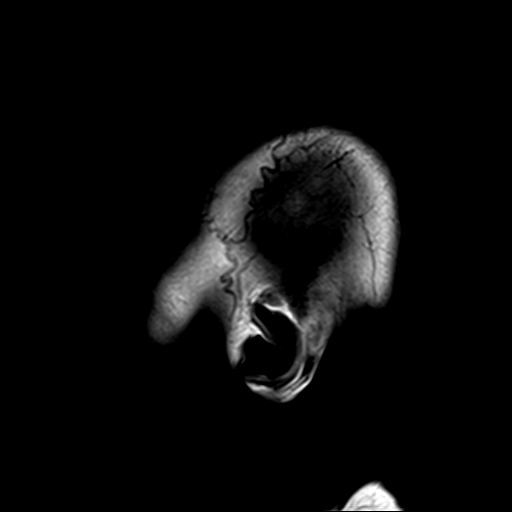
[im 23/23]
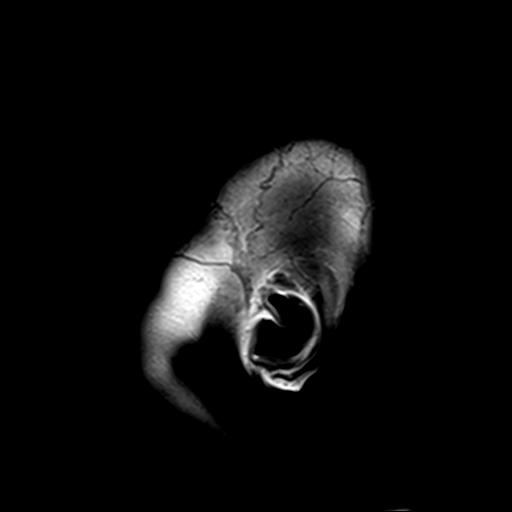

[Series 3: DWI · axial · 3.0mm · 1.80mm/px · z∈[-61,+86]mm · 6 of 99 slices shown (1 of 4)]
[im 1/99]
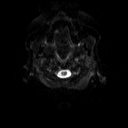
[im 20/99]
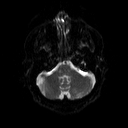
[im 40/99]
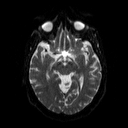
[im 59/99]
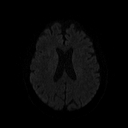
[im 79/99]
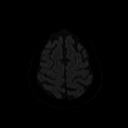
[im 99/99]
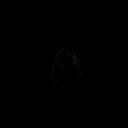

[Series 4: DWI · axial · 3.0mm · 1.80mm/px · z∈[-61,+86]mm · 3 of 50 slices shown (2 of 4)]
[im 1/50]
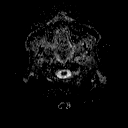
[im 25/50]
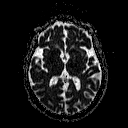
[im 50/50]
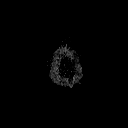

[Series 5: DWI · coronal · 5.0mm · 1.80mm/px · 5 of 74 slices shown (3 of 4)]
[im 1/74]
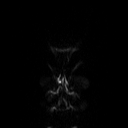
[im 19/74]
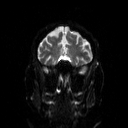
[im 37/74]
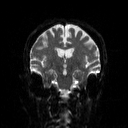
[im 55/74]
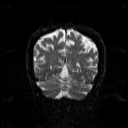
[im 74/74]
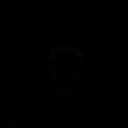

[Series 6: DWI · coronal · 5.0mm · 1.80mm/px · 2 of 37 slices shown (4 of 4)]
[im 1/37]
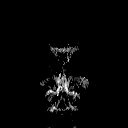
[im 37/37]
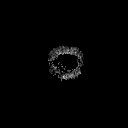

[Series 7: T2 · axial · 5.0mm · 0.60mm/px · 1 of 22 slices shown (1 of 2)]
[im 1/22]
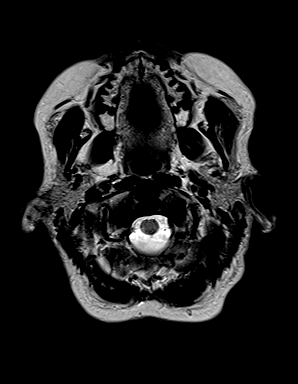

[Series 8: FLAIR · axial · 3.0mm · 0.45mm/px · z∈[-55,+79]mm · 2 of 30 slices shown]
[im 1/30]
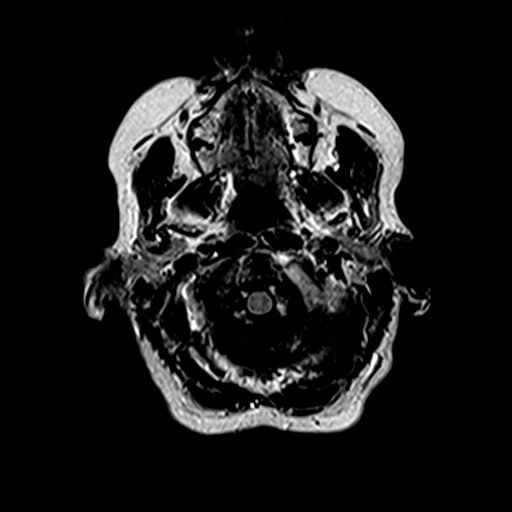
[im 30/30]
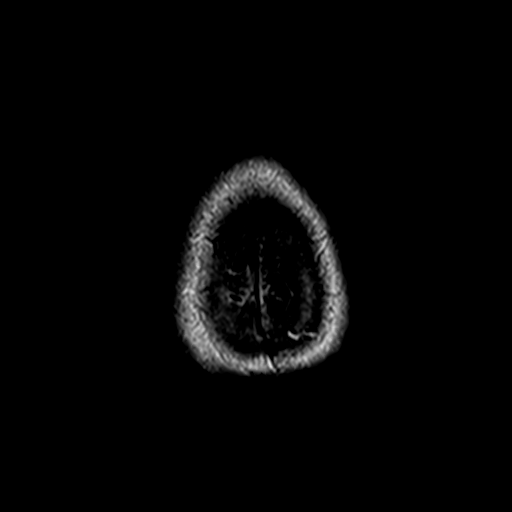

[Series 9: mip_images(sw) · axial · 32.0mm · 0.90mm/px · z∈[-44,+68]mm · 2 of 29 slices shown]
[im 1/29]
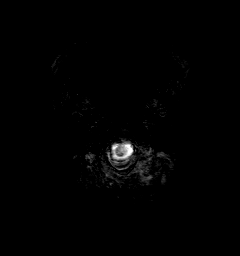
[im 29/29]
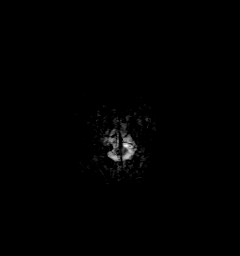

[Series 10: swi_images · axial · 4.0mm · 0.90mm/px · z∈[-58,+82]mm · 2 of 36 slices shown]
[im 1/36]
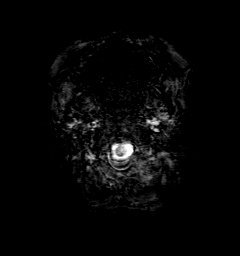
[im 36/36]
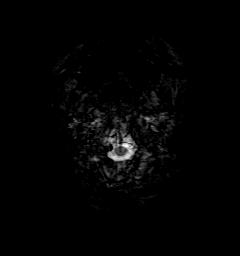

[Series 11: t1_mpr_tra · axial · 1.0mm · 0.75mm/px · z∈[-59,+84]mm · 9 of 144 slices shown]
[im 1/144]
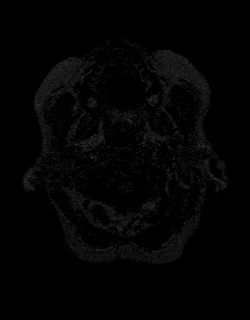
[im 18/144]
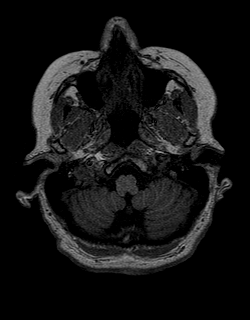
[im 36/144]
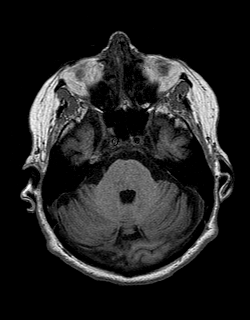
[im 54/144]
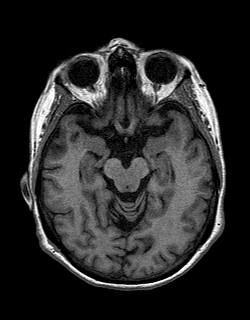
[im 72/144]
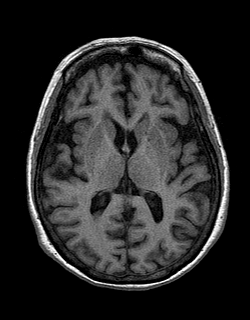
[im 90/144]
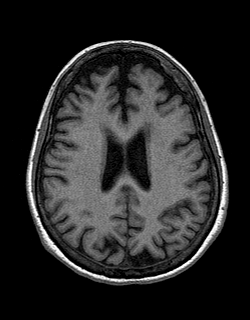
[im 108/144]
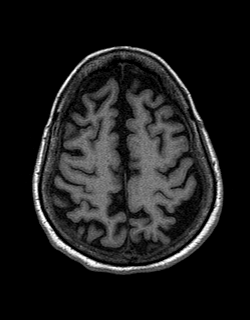
[im 126/144]
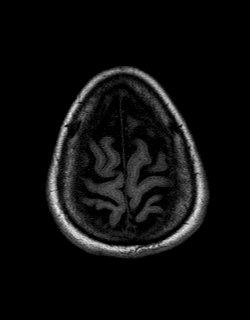
[im 144/144]
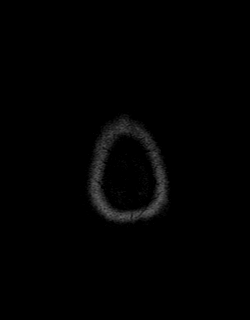

[Series 12: T2 · coronal · 5.0mm · 0.45mm/px · 2 of 26 slices shown (2 of 2)]
[im 1/26]
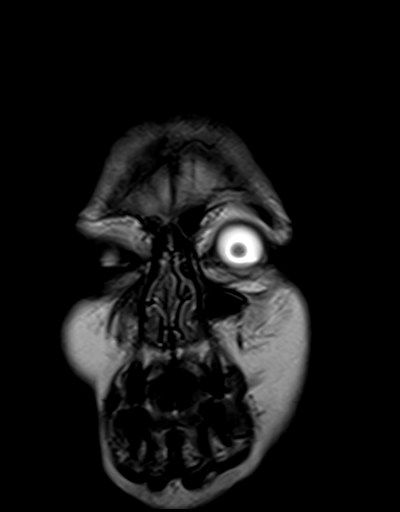
[im 26/26]
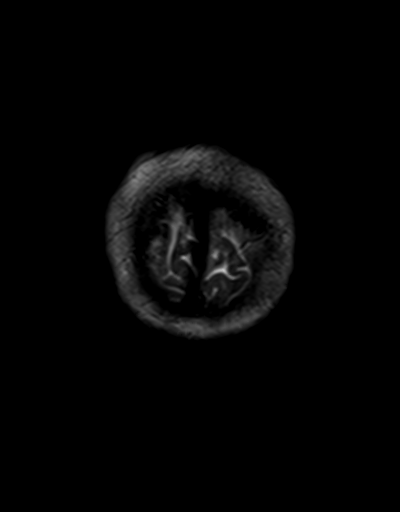

[Series 13: t1_mpr_tra post · axial · 1.0mm · 0.75mm/px · z∈[-59,+84]mm · 9 of 144 slices shown]
[im 1/144]
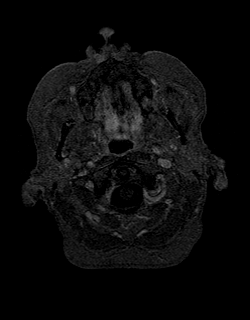
[im 18/144]
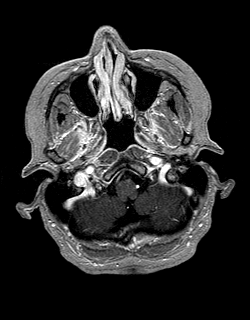
[im 36/144]
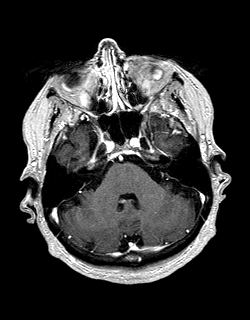
[im 54/144]
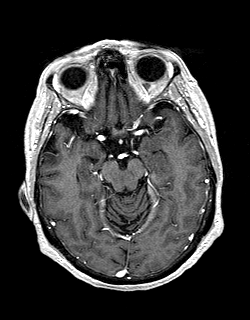
[im 72/144]
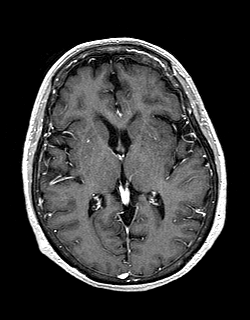
[im 90/144]
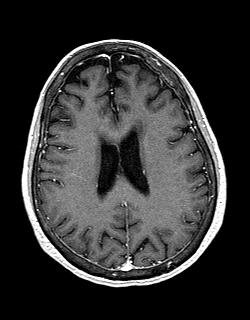
[im 108/144]
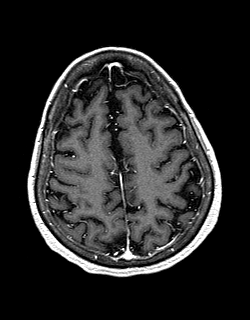
[im 126/144]
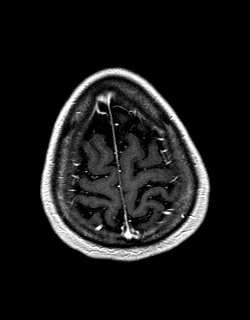
[im 144/144]
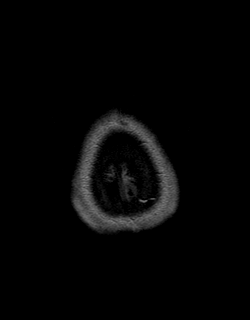

[Series 14: post cor · coronal · 5.0mm · 0.45mm/px · 2 of 26 slices shown]
[im 1/26]
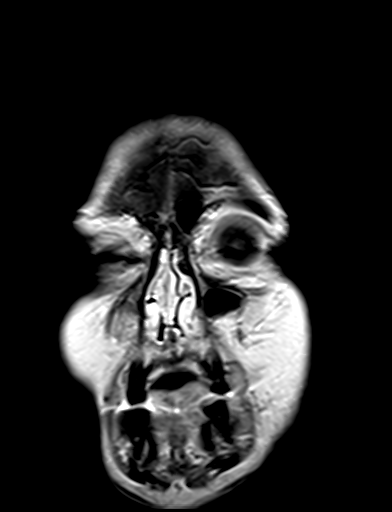
[im 26/26]
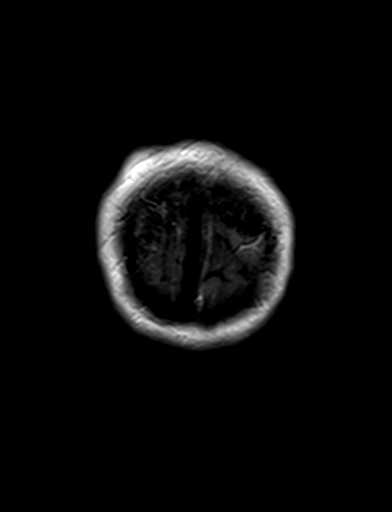

[Series 15: post sag · sagittal · 5.0mm · 0.45mm/px · 1 of 23 slices shown]
[im 1/23]
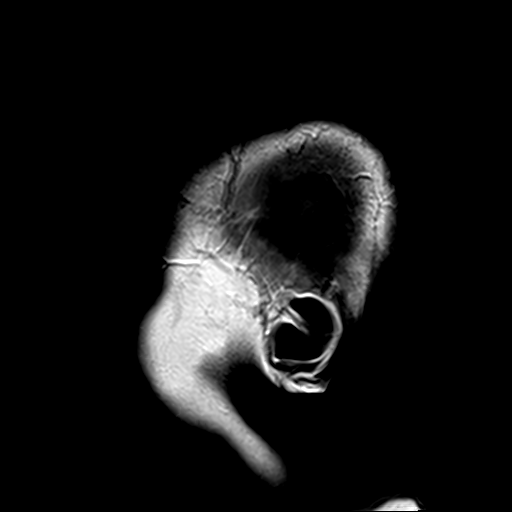

[48 of 48 positions shown; findings below may reference images not displayed]

FINDINGS: Brain: No acute infarct, mass effect or extra-axial collection. No
acute or chronic hemorrhage. There is hyperintense T2-weighted
signal within the superior cerebellar peduncles and posterior
midbrain in addition to the left basal ganglia. Mild superior
cerebellar atrophy. There is no abnormal contrast enhancement.

Vascular: Major flow voids are preserved.

Skull and upper cervical spine: Normal calvarium and skull base.
Visualized upper cervical spine and soft tissues are normal.

Sinuses/Orbits:No paranasal sinus fluid levels or advanced mucosal
thickening. No mastoid or middle ear effusion. Normal orbits.
IMPRESSION: 1. No acute intracranial abnormality.
2. Hyperintense T2-weighted signal within the superior cerebellar
peduncles, posterior midbrain and left basal ganglia with mild
superior cerebellar atrophy. Findings suggest Wernicke
encephalopathy.

## 2021-03-03 NOTE — Progress Notes (Signed)
Assessment/Plan:    Amnestic Mild Cognitive Impairment    Recommendations:  Discussed safety both in and out of the home.  Discussed the importance of regular daily schedule with inclusion of crossword puzzles to maintain brain function.  Continue to monitor mood by PCP Stay active at least 30 minutes at least 3 times a week.  Naps should be scheduled and should be no longer than 60 minutes and should not occur after 2 PM.  Mediterranean diet is recommended  Repeat neurocognitive testing in 2 years  Follow up in  6 months.   Case discussed with Dr. 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 This patient is accompanied in the office by her son who supplements the history.  Previous records as well as any outside records available were reviewed prior to todays visit.  She was last seen on 11/25/20 with MoCA 26/30=MMSE 29/30.   MRI of the brain at that time showed mild superior cerebellar atrophy without any acute findings.  Labs were normal.  She had neurocognitive testing yielding a diagnosis of amnestic MCI, likely due to Alzheimer's disease versus Korsakoff's.  She is here today in follow-up.  Her son states that since her last visit, she is "repeating herself less, and her mood is better ". She denies leaving objects in unusual places.  She continues to drive, denies getting lost.  Although tearful at times, her mood has been overall stable, especially since adjusting her antidepressant to a therapeutic dose by her PCP.  She admits to not doing enough puzzles or word finding, but she is planning to "make a point of increasing their activity ".  She sleeps well, and denies any vivid dreams or sleepwalking.  No  hallucinations or paranoia reported.  She is independent of bathing and dressing.  She denies forgetting to take her medications.  Her husband has always managed the finances.  Her appetite is "slightly decreased ".  She is trying to incorporate the mind diet into her diet, having decrease the dairy products consumption.  She denies any trouble swallowing.  She does not cook frequently.  She denies leaving objects in unusual places.  She denies any falls or head injuries.  Occasionally, she has some migraines on the "top of the head ".  No photophobia, nausea or vomiting associated with it.  She denies any double vision, dizziness, focal numbness or tingling, unilateral weakness or tremors.  She denies urine incontinence or retention, constipation or diarrhea or anosmia.    Initial visit 11/22/20 The patient is seen in neurologic consultation at the request of Megan Floro, MD for the evaluation of memory.  The patient is accompanied by son Megan Schultz who supplements the history. She is a 70 y.o. year old female who has had memory issues for about  3 years, Initially seen by Dr. Marjory Schultz at [facility] on 05/14/20, diagnosed with MCI. MMSE  at the time was 28.  No medications were recommended at the time. Initially,her memory issues were noted by her son during the Summer of 2019 when she repeated the same conversation, not remembering she had talked about it before. She also asked similar questions. She adds"it's not uncommon for me to have 3 things  in my mind, so I might forget I asked". She reports that the Covid pandemic has greatly affected her. Her relationship with her husband is not good, and has been worse over the last 2 years, with " possibly an infidelity too"-son says. SHe does not do any activities without him, and other than enjoying her grandchildren and running with the other son daily, 1-2 miles a day, she is frequently bored. She cries often, afraid that she has dementia like her mother.  She has not  yet seen a therapist or psychiatrist. Recently, the patient was started on Celexa, which does not seem to help, as the patient is taking it as needed rather than on a regimen.  She states that this makes her very sleepy, because she is taking it in the morning.  She sleeps about 8 hours, without vivid dreams or sleepwalking.  She denies hallucinations or paranoia.  She is independent of bathing and dressing.  Her husband has been always in charge of the finances, "is his thing".  She does not cook a lot, because her husband likes to go [employer] all the time, and her diet includes a lot of bread, and ice cream.  She has not been eating a healthy diet in years.  She does not take a lot of medications, but she is able to take it and not forget the doses.  She denies leaving objects in unusual places.  Her appetite is good, and denies any trouble swallowing.  The patient continues to drive, denies getting lost.  She denies any headaches, falls, injuries to the head except for a skiing accident 30 years ago, with neck pain x 5 years requiring a chiropractor, double vision, dizziness, focal numbness or tingling, unilateral weakness or tremors.  Denies urine incontinence or retention, constipation or diarrhea.  Denies anosmia.  Denies a history of OSA, alcohol or tobacco.  As mentioned before, her family history is remarkable for dementia in her mother, whom she took care of until she died.  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     MRI brain with and without contrast 06/20/20 1. No acute intracranial abnormality. 2. Hyperintense T2-weighted signal within the superior cerebellar peduncles, posterior midbrain and left basal ganglia with mild superior cerebellar atrophy. Findings suggest Wernicke encephalopathy.  Neurocognitive testing 01/09/21 Megan Schultz  "We discussed impression of mild cognitive impairment, with memory loss based on clinical history, as reflected in the patient instructions. I discussed  possible etiologies including AD and wernicke-korsakoff, and we also discussed primary prevention, including MIND diet, exercise (she is already doing some), and the like. We discussed the importance of not catastrophizing, and the difference between Alzheimer's disease, the clinical state of dementia, and mild cognitive impairment."    Labs 05/14/20 Vit B12 05/04/20  653 TSH 2.090 B1 123.1     PREVIOUS MEDICATIONS:   CURRENT MEDICATIONS:  Outpatient Encounter Medications as of 03/04/2021  Medication Sig   citalopram (CELEXA) 20 MG tablet Take 20-30 mg by mouth daily.   No facility-administered encounter medications on file as of 03/04/2021.     Objective:     PHYSICAL EXAMINATION:    VITALS:   Vitals:   03/04/21 0911  BP: 107/72  Pulse: 78  Resp: 18  SpO2: 95%  Weight: 156 lb (70.8 kg)  Height: 5\' 5"  (1.651 m)    GEN:  The patient appears stated age and is in NAD. HEENT:  Normocephalic, atraumatic.   Neurological examination:  General: NAD, well-groomed,  appears stated age. Orientation: The patient is alert. Oriented to person, place and date Cranial nerves: There is good facial symmetry.The speech is fluent and clear. No aphasia or dysarthria. Fund of knowledge is appropriate. Recent and remote memory are impaired. Attention and concentration are normal.  Able to name objects and repeat phrases.  Hearing is intact to conversational tone.    Sensation: Sensation is intact to light touch throughout Motor: Strength is at least antigravity x4. Tremors: none  DTR's 2/4 in UE/LE     Montreal Cognitive Assessment  11/22/2020  Visuospatial/ Executive (0/5) 5  Naming (0/3) 3  Attention: Read list of digits (0/2) 2  Attention: Read list of letters (0/1) 1  Attention: Serial 7 subtraction starting at 100 (0/3) 3  Language: Repeat phrase (0/2) 2  Language : Fluency (0/1) 1  Abstraction (0/2) 2  Delayed Recall (0/5) 2  Orientation (0/6) 5  Total 26  Adjusted Score (based  on education) 26   MMSE - Mini Mental State Exam 05/14/2020  Orientation to time 5  Orientation to Place 5  Registration 3  Attention/ Calculation 3  Recall 3  Language- name 2 objects 2  Language- repeat 1  Language- follow 3 step command 3  Language- read & follow direction 1  Write a sentence 1  Copy design 1  Total score 28    No flowsheet data found.     Movement examination: Tone: There is normal tone in the UE/LE Abnormal movements:  no tremor.  No myoclonus.  No asterixis.   Coordination:  There is no decremation with RAM's. Normal finger to nose  Gait and Station: The patient has no difficulty arising out of a deep-seated chair without the use of the hands. The patient's stride length is good.  Gait is cautious and narrow.        Total time spent on today's visit was 40 minutes, including both face-to-face time and nonface-to-face time. Time included that spent on review of records (prior notes available to me/labs/imaging if pertinent), discussing treatment and goals, answering patient's questions and coordinating care.  Cc:  Megan Floro, MD [name], PA-C

## 2021-03-04 ENCOUNTER — Other Ambulatory Visit: Payer: Self-pay

## 2021-03-04 ENCOUNTER — Ambulatory Visit: Payer: Medicare Other | Admitting: Physician Assistant

## 2021-03-04 ENCOUNTER — Encounter: Payer: Self-pay | Admitting: Physician Assistant

## 2021-03-04 VITALS — BP 107/72 | HR 78 | Resp 18 | Ht 65.0 in | Wt 156.0 lb

## 2021-03-04 DIAGNOSIS — G3184 Mild cognitive impairment, so stated: Secondary | ICD-10-CM | POA: Diagnosis not present

## 2021-03-04 NOTE — Patient Instructions (Signed)
It was a pleasure to see you today at our office.   Recommendations:  Follow up in 6  months   RECOMMENDATIONS FOR ALL PATIENTS WITH MEMORY PROBLEMS: 1. Continue to exercise (Recommend 30 minutes of walking everyday, or 3 hours every week) 2. Increase social interactions - continue going to Church and enjoy social gatherings with friends and family 3. Eat healthy, avoid fried foods and eat more fruits and vegetables 4. Maintain adequate blood pressure, blood sugar, and blood cholesterol level. Reducing the risk of stroke and cardiovascular disease also helps promoting better memory. 5. Avoid stressful situations. Live a simple life and avoid aggravations. Organize your time and prepare for the next day in anticipation. 6. Sleep well, avoid any interruptions of sleep and avoid any distractions in the bedroom that may interfere with adequate sleep quality 7. Avoid sugar, avoid sweets as there is a strong link between excessive sugar intake, diabetes, and cognitive impairment We discussed the Mediterranean diet, which has been shown to help patients reduce the risk of progressive memory disorders and reduces cardiovascular risk. This includes eating fish, eat fruits and green leafy vegetables, nuts like almonds and hazelnuts, walnuts, and also use olive oil. Avoid fast foods and fried foods as much as possible. Avoid sweets and sugar as sugar use has been linked to worsening of memory function.  There is always a concern of gradual progression of memory problems. If this is the case, then we may need to adjust level of care according to patient needs. Support, both to the patient and caregiver, should then be put into place.    FALL PRECAUTIONS: Be cautious when walking. Scan the area for obstacles that may increase the risk of trips and falls. When getting up in the mornings, sit up at the edge of the bed for a few minutes before getting out of bed. Consider elevating the bed at the head end to  avoid drop of blood pressure when getting up. Walk always in a well-lit room (use night lights in the walls). Avoid area rugs or power cords from appliances in the middle of the walkways. Use a walker or a cane if necessary and consider physical therapy for balance exercise. Get your eyesight checked regularly.  FINANCIAL OVERSIGHT: Supervision, especially oversight when making financial decisions or transactions is also recommended.  HOME SAFETY: Consider the safety of the kitchen when operating appliances like stoves, microwave oven, and blender. Consider having supervision and share cooking responsibilities until no longer able to participate in those. Accidents with firearms and other hazards in the house should be identified and addressed as well.   ABILITY TO BE LEFT ALONE: If patient is unable to contact 911 operator, consider using LifeLine, or when the need is there, arrange for someone to stay with patients. Smoking is a fire hazard, consider supervision or cessation. Risk of wandering should be assessed by caregiver and if detected at any point, supervision and safe proof recommendations should be instituted.  MEDICATION SUPERVISION: Inability to self-administer medication needs to be constantly addressed. Implement a mechanism to ensure safe administration of the medications.   DRIVING: Regarding driving, in patients with progressive memory problems, driving will be impaired. We advise to have someone else do the driving if trouble finding directions or if minor accidents are reported. Independent driving assessment is available to determine safety of driving.   If you are interested in the driving assessment, you can contact the following:  [employer] in [place] [phone]  Driver   Rehabil[facility] [phone]  [facility] [phone]  [facility] [phone] or [phone]   

## 2021-03-26 DIAGNOSIS — R21 Rash and other nonspecific skin eruption: Secondary | ICD-10-CM | POA: Diagnosis not present

## 2021-03-26 DIAGNOSIS — D692 Other nonthrombocytopenic purpura: Secondary | ICD-10-CM | POA: Diagnosis not present

## 2021-04-02 ENCOUNTER — Encounter: Payer: Medicare Other | Admitting: Counselor

## 2021-04-10 ENCOUNTER — Encounter: Payer: Medicare Other | Admitting: Counselor

## 2021-04-16 ENCOUNTER — Ambulatory Visit: Payer: Medicare Other | Admitting: Physician Assistant

## 2021-08-18 DIAGNOSIS — R42 Dizziness and giddiness: Secondary | ICD-10-CM | POA: Diagnosis not present

## 2021-08-18 DIAGNOSIS — H6592 Unspecified nonsuppurative otitis media, left ear: Secondary | ICD-10-CM | POA: Diagnosis not present

## 2021-08-18 DIAGNOSIS — R110 Nausea: Secondary | ICD-10-CM | POA: Diagnosis not present

## 2021-08-22 DIAGNOSIS — J309 Allergic rhinitis, unspecified: Secondary | ICD-10-CM | POA: Diagnosis not present

## 2021-08-22 DIAGNOSIS — H8113 Benign paroxysmal vertigo, bilateral: Secondary | ICD-10-CM | POA: Diagnosis not present

## 2021-09-02 ENCOUNTER — Encounter: Payer: Self-pay | Admitting: Physician Assistant

## 2021-09-02 ENCOUNTER — Ambulatory Visit: Payer: Medicare Other | Admitting: Physician Assistant

## 2021-09-02 VITALS — BP 110/60 | HR 31 | Resp 18 | Ht 65.0 in | Wt 164.0 lb

## 2021-09-02 DIAGNOSIS — G3184 Mild cognitive impairment, so stated: Secondary | ICD-10-CM | POA: Diagnosis not present

## 2021-09-02 MED ORDER — MEMANTINE HCL 5 MG PO TABS: 5.0000 mg | ORAL_TABLET | Freq: Every day | ORAL | 11 refills | Status: DC

## 2021-09-02 MED ORDER — DONEPEZIL HCL 5 MG PO TABS: 5.0000 mg | ORAL_TABLET | Freq: Every day | ORAL | 11 refills | Status: DC

## 2021-09-02 NOTE — Progress Notes (Signed)
? ?Assessment/Plan:  ? 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 This patient is accompanied in the office by her husband who supplements the history.  Previous records as well as any outside records available were reviewed prior to todays visit.  She was last seen on 11/25/20 with MoCA 26/30. MRI of the brain at that time showed mild superior cerebellar atrophy without any acute findings.  Labs were normal.  She had neurocognitive testing yielding a diagnosis of amnestic MCI, likely due to Alzheimer's disease versus Korsakoff's.  She is not on antidementia meds. ? ?Mild Cognitive Impairment, amnestic, likely due to Alzheimer's Disease versus Korsakoff's ? ? Recommendations:  ? ?Discussed safety both in and out of the home.  ?Discussed the importance of regular daily schedule with inclusion of crossword puzzles to maintain brain function.  ?Continue to monitor mood by PCP ?Stay active at least 30 minutes at least 3 times a week.  ?Naps should be scheduled and should be no longer than 60 minutes and should not occur after 2 PM.  ?Mediterranean diet is recommended  ?Control cardiovascular risk factors, follow up with PCP regarding bradycardia, may need Cards evaluation  ?Start memantine 5 mg daily ( h/o bradycardia)Side effects were discussed ?Follow up in 6  months. ? ? ?Case discussed with Megan Schultz who agrees with the plan ? ? ? ? ?Subjective:  ? ? ?Megan Schultz is a very pleasant 71 y.o. RH female  seen today in follow up for memory loss. This patient is accompanied in the office by her husband who supplements the history.  Previous records as well as any outside records available were reviewed prior to todays visit.  Patient was last seen at our office on 03/14/21. Last MMSE  on 11/22/20 was 26/30. SHe is not on antidementia meds. ? ?Any changes in memory  since last visit? Since last visit, patient reports that memory is about the same. She does more crossword puzzles and word finding than before. ?Patient lives with: Spouse   ?repeats oneself? "Sometimes, not often" ?Disoriented when walking into a room?  Patient denies   ?Leaving objects in unusual places?  Patient denies   ?Ambulates  with difficulty?   Patient denies   ?Recent falls?  Patient denies   ?Any head injuries?  Patient denies   ?History of seizures?   Patient denies   ?Wandering behavior?  Patient denies   ?Patient drives? She drives without difficulties, denies getting lost   ?Any mood changes such irritability agitation?  Patient denies . She is in a better mood.  ?Any history of depression?:  Endorsed, but meds are well adjusted.   ?Hallucinations?  Patient denies   ?Paranoia?  Patient denies   ?Patient reports that he sleeps well without vivid dreams, REM behavior or sleepwalking   Patient reports vivid dreams   ?History of sleep apnea?  Patient denies   ?Any hygiene concerns?  Patient denies   ?Independent of bathing and dressing?  Endorsed  ?Does the patient needs help with medications?  Patient denies   ?Who is in charge of the finances? Husband is in charge.   ?Any changes in appetite?  Patient denies   ?Patient have trouble swallowing? Patient denies   ?Does the patient cook?  Patient denies   ?Any kitchen accidents such as leaving the stove on? Patient denies   ?  Any headaches?  SHe has a history of migraines "on top of my head", but they are well controlled with Tylenol. ?The double vision? Patient denies   ?Vertigo? Endorsed She recently had BPV, treated with Epley maneuver, Zofran without recurrence.  ?Any focal numbness or tingling?  Patient denies   ?Chronic back pain Patient denies   ?Unilateral weakness?  Patient denies   ?Any tremors?  Patient denies   ?Any history of anosmia?  Patient denies   ?Any incontinence of urine?  Patient denies   ?Any bowel dysfunction?   Patient denies      ? ? ? ?Initial visit 11/22/20 The patient is seen in neurologic consultation at the request of Megan Cruel, Megan Schultz for the evaluation of memory.  The patient is accompanied by son Megan Schultz who supplements the history. She is a 71 y.o. year old female who has had memory issues for about  3 years, Initially seen by Dr. [name] at [facility] on 05/14/20, diagnosed with MCI. MMSE  at the time was 28.  No medications were recommended at the time. ?Initially,her memory issues were noted by her son during the Summer of 2019 when she repeated the same conversation, not remembering she had talked about it before. She also asked similar questions. She adds"it's not uncommon for me to have 3 things in my mind, so I might forget I asked". She reports that the Covid pandemic has greatly affected her. Her relationship with her husband is not good, and has been worse over the last 2 years, with " possibly an infidelity too"-son says. SHe does not do any activities without him, and other than enjoying her grandchildren and running with the other son daily, 1-2 miles a day, she is frequently bored. She cries often, afraid that she has dementia like her mother.  She has not yet seen a therapist or psychiatrist. Recently, the patient was started on Celexa, which does not seem to help, as the patient is taking it as needed rather than on a regimen.  She states that this makes her very sleepy, because she is taking it in the morning.  She sleeps about 8 hours, without vivid dreams or sleepwalking.  She denies hallucinations or paranoia.  She is independent of bathing and dressing.  Her husband has been always in charge of the finances, "is his thing".  She does not cook a lot, because her husband likes to go [employer] all the time, and her diet includes a lot of bread, and ice cream.  She has not been eating a healthy diet in years.  She does not take a lot of medications, but she is able to take it and not forget the doses.  She denies  leaving objects in unusual places.  Her appetite is good, and denies any trouble swallowing.  The patient continues to drive, denies getting lost.  She denies any headaches, falls, injuries to the head except for a skiing accident 30 years ago, with neck pain x 5 years requiring a chiropractor, double vision, dizziness, focal numbness or tingling, unilateral weakness or tremors.  Denies urine incontinence or retention, constipation or diarrhea.  Denies anosmia.  Denies a history of OSA, alcohol or tobacco.  As mentioned before, her family history is remarkable for dementia in her mother, whom she took care of until she died.  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 ?  ?  ?MRI brain with and without contrast 06/20/20 ?1. No acute intracranial abnormality. ?2. Hyperintense  T2-weighted signal within the superior cerebellar peduncles, posterior midbrain and left basal ganglia with mild superior cerebellar atrophy. Findings suggest Wernicke encephalopathy. ?  ?Neurocognitive testing 01/09/21 Dr. Nicole Kindred  ?"We discussed impression of mild cognitive impairment, with memory loss based on clinical history, as reflected in the patient instructions. I discussed possible etiologies including AD and wernicke-korsakoff, and we also discussed primary prevention, including MIND diet, exercise (she is already doing some), and the like. We discussed the importance of not catastrophizing, and the difference between Alzheimer's disease, the clinical state of dementia, and mild cognitive impairment."  ?  ?Labs 05/14/20 ?Vit B12 05/04/20  653 ?TSH 2.090 ?B1 123.1  ? ?Past Medical History:  ?Diagnosis Date  ? ADD (attention deficit disorder)   ? High cholesterol   ? Major depression in partial remission ([place])   ? Memory changes   ? Muscle spasm   ?  ? ?Past Surgical History:  ?Procedure Laterality Date  ? CHOLECYSTECTOMY    ? FOOT SURGERY Right   ? SHOULDER ARTHROSCOPY    ?  ? ?PREVIOUS MEDICATIONS:  ? ?CURRENT MEDICATIONS:  ?Outpatient  Encounter Medications as of 09/02/2021  ?Medication Sig  ? B-Complex TABS 1 tablet  ? cetirizine (ZYRTEC) 10 MG tablet Take 10 mg by mouth daily.  ? citalopram (CELEXA) 20 MG tablet Take 20-30 mg by mouth daily.  ?

## 2021-09-02 NOTE — Patient Instructions (Addendum)
It was a pleasure to see you today at our office.  ? ?Recommendations: ? ?Follow up in 6 months ?Repeat the neurocognitive testing ?Start memantine  5mg   daily. Side effects discussed   ? ?RECOMMENDATIONS FOR ALL PATIENTS WITH MEMORY PROBLEMS: ?1. Continue to exercise (Recommend 30 minutes of walking everyday, or 3 hours every week) ?2. Increase social interactions - continue going to [place] and enjoy social gatherings with friends and family ?3. Eat healthy, avoid fried foods and eat more fruits and vegetables ?4. Maintain adequate blood pressure, blood sugar, and blood cholesterol level. Reducing the risk of stroke and cardiovascular disease also helps promoting better memory. ?5. Avoid stressful situations. Live a simple life and avoid aggravations. Organize your time and prepare for the next day in anticipation. ?6. Sleep well, avoid any interruptions of sleep and avoid any distractions in the bedroom that may interfere with adequate sleep quality ?7. Avoid sugar, avoid sweets as there is a strong link between excessive sugar intake, diabetes, and cognitive impairment ?We discussed the Mediterranean diet, which has been shown to help patients reduce the risk of progressive memory disorders and reduces cardiovascular risk. This includes eating fish, eat fruits and green leafy vegetables, nuts like almonds and hazelnuts, walnuts, and also use olive oil. Avoid fast foods and fried foods as much as possible. Avoid sweets and sugar as sugar use has been linked to worsening of memory function. ? ?There is always a concern of gradual progression of memory problems. If this is the case, then we may need to adjust level of care according to patient needs. Support, both to the patient and caregiver, should then be put into place.  ? ? ?FALL PRECAUTIONS: Be cautious when walking. Scan the area for obstacles that may increase the risk of trips and falls. When getting up in the mornings, sit up at the edge of the bed for a  few minutes before getting out of bed. Consider elevating the bed at the head end to avoid drop of blood pressure when getting up. Walk always in a well-lit room (use night lights in the walls). Avoid area rugs or power cords from appliances in the middle of the walkways. Use a walker or a cane if necessary and consider physical therapy for balance exercise. Get your eyesight checked regularly. ? ?FINANCIAL OVERSIGHT: Supervision, especially oversight when making financial decisions or transactions is also recommended. ? ?HOME SAFETY: Consider the safety of the kitchen when operating appliances like stoves, microwave oven, and blender. Consider having supervision and share cooking responsibilities until no longer able to participate in those. Accidents with firearms and other hazards in the house should be identified and addressed as well. ? ? ?ABILITY TO BE LEFT ALONE: If patient is unable to contact 911 operator, consider using LifeLine, or when the need is there, arrange for someone to stay with patients. Smoking is a fire hazard, consider supervision or cessation. Risk of wandering should be assessed by caregiver and if detected at any point, supervision and safe proof recommendations should be instituted. ? ?MEDICATION SUPERVISION: Inability to self-administer medication needs to be constantly addressed. Implement a mechanism to ensure safe administration of the medications. ? ? ?DRIVING: Regarding driving, in patients with progressive memory problems, driving will be impaired. We advise to have someone else do the driving if trouble finding directions or if minor accidents are reported. Independent driving assessment is available to determine safety of driving. ? ? ?If you are interested in the driving assessment, you can  contact the following: ? ?[employer] in [place] ? ?[place] [phone] ? ?[facility] [phone] ? ?[facility]  [phone] or [phone] ?  ?

## 2021-09-04 DIAGNOSIS — R0989 Other specified symptoms and signs involving the circulatory and respiratory systems: Secondary | ICD-10-CM | POA: Diagnosis not present

## 2021-09-10 ENCOUNTER — Encounter: Payer: Self-pay | Admitting: Cardiovascular Disease

## 2021-09-10 ENCOUNTER — Ambulatory Visit: Payer: Medicare Other | Admitting: Cardiovascular Disease

## 2021-09-10 VITALS — BP 126/78 | HR 66 | Ht 64.0 in | Wt 161.4 lb

## 2021-09-10 DIAGNOSIS — R002 Palpitations: Secondary | ICD-10-CM

## 2021-09-10 DIAGNOSIS — I493 Ventricular premature depolarization: Secondary | ICD-10-CM | POA: Diagnosis not present

## 2021-09-10 DIAGNOSIS — E7800 Pure hypercholesterolemia, unspecified: Secondary | ICD-10-CM | POA: Diagnosis not present

## 2021-09-10 NOTE — Patient Instructions (Signed)
Medication Instructions:  ?No changes ?*If you need a refill on your cardiac medications before your next appointment, please call your pharmacy* ? ? ?Lab Work: ?None ordered ?If you have labs (blood work) drawn today and your tests are completely normal, you will receive your results only by: ?MyChart Message (if you have MyChart) OR ?A paper copy in the mail ?If you have any lab test that is abnormal or we need to change your treatment, we will call you to review the results. ? ? ?Testing/Procedures: ?Your physician has requested that you have an echocardiogram. Echocardiography is a painless test that uses sound waves to create images of your heart. It provides your doctor with information about the size and shape of your heart and how well your heart?s chambers and valves are working. You may receive an ultrasound enhancing agent through an IV if needed to better visualize your heart during the echo.This procedure takes approximately one hour. There are no restrictions for this procedure. This will take place at the [address], [address].  ? ?Your physician has requested that you have an exercise tolerance test. For further information please visit [email or web address] Please also follow instruction sheet, as given. This will take place at [address] [facility], [address]. ?Do not drink or eat foods with caffeine for 24 hours before the test. (Chocolate, coffee, tea, or energy drinks) ?If you use an inhaler, bring it with you to the test. ?Do not smoke for 4 hours before the test. ?Wear comfortable shoes and clothing. ? ? ? ?Follow-Up: ?At [facility], you and your health needs are our priority.  As part of our continuing mission to provide you with exceptional heart care, we have created designated Provider Care Teams.  These Care Teams include your primary Cardiologist (physician) and Advanced Practice Providers (APPs -  Physician Assistants and Nurse Practitioners) who all work together to provide you  with the care you need, when you need it. ? ?We recommend signing up for the patient portal called "MyChart".  Sign up information is provided on this After Visit Summary.  MyChart is used to connect with patients for Virtual Visits (Telemedicine).  Patients are able to view lab/test results, encounter notes, upcoming appointments, etc.  Non-urgent messages can be sent to your provider as well.   ?To learn more about what you can do with MyChart, go to [email or web address].   ? ?Your next appointment:   ?Follow up as needed with Dr. Royann Shivers ? ? ?Important Information About Sugar ? ? ? ? ? ? ?

## 2021-09-10 NOTE — Progress Notes (Signed)
?Cardiology Office Note:   ? ?Date:  09/13/2021  ? ?ID:  MARIZOL BORROR, DOB 02/07/1951, MRN [id number] ? ?PCP:  [name], MD ?  ?[facility] Providers ?Cardiologist:  Thurmon Fair, MD    ? ?Referring MD: [name], MD  ? ?Chief Complaint  ?Patient presents with  ? Irregular Heart Beat  ?Megan Schultz is a 71 y.o. female who is being seen today for the evaluation of possible VT at the request of [name], MD. ? ? ?History of Present Illness:   ? ?Megan Schultz is a 71 y.o. female with a hx of hypercholesterolemia (without known CAD or PAD) and cognitive deficits (currently on memantine) and history of depression, referred for abnormalities on home ECG recordings. ? ?They started recording with an Apple watch, since palpatory heart rate was low at 30 bpm.  The smart watch recordings confirmed that the bradycardia was artifactual, due to ventricular bigeminy. ? ?One of the recordings was considered suspicious for ventricular tachycardia, but I am confident that this is due to motion artifact.  However other rhythm strips clearly show ventricular bigeminy and ventricular couplets.  ECG in our office shows sinus rhythm with ventricular trigeminy.  The PVCs are monomorphic and suggestive of outflow tract origin (monophasic left bundle branch block morphology, vertical axis with very tall voltage in the inferior leads, precordial transition in V2-V3).  Her ECG otherwise shows a minor intraventricular conduction delay with polyphasic pattern in leads V1-V2 but with a narrow QRS at 98 ms, left axis deviation due to left anterior fascicular block.  There is questionable left atrial abnormality.  The QTc is 444 ms and there are no ischemic repolarization changes.  No epsilon wave is seen. ? ?The PVCs appear to be completely asymptomatic.  She also denies any other cardiovascular complaints. ? ?She has always been very fit.  She still goes for long walks with her son and even jogs a  little bit at times. ? ?The patient specifically denies any chest pain at rest or with exertion, dyspnea at rest or with exertion, orthopnea, paroxysmal nocturnal dyspnea, syncope, palpitations, focal neurological deficits, intermittent claudication, lower extremity edema, unexplained weight gain, cough, hemoptysis or wheezing. ? ? ?Past Medical History:  ?Diagnosis Date  ? ADD (attention deficit disorder)   ? High cholesterol   ? Major depression in partial remission (HCC)   ? Memory changes   ? Muscle spasm   ? ? ?Past Surgical History:  ?Procedure Laterality Date  ? CHOLECYSTECTOMY    ? FOOT SURGERY Right   ? SHOULDER ARTHROSCOPY    ? ? ?Current Medications: ?Current Meds  ?Medication Sig  ? B-Complex TABS daily in the afternoon.  ? citalopram (CELEXA) 20 MG tablet Take 20 mg by mouth daily.  ? memantine (NAMENDA) 5 MG tablet Take 1 tablet (5 mg total) by mouth daily.  ?  ? ?Allergies:   Amoxicillin  ? ?Social History  ? ?Socioeconomic History  ? Marital status: Married  ?  Spouse name: Nicholes Mango  ? Number of children: 3  ? Years of education: Diploma RN  ? Highest education level: Associate degree: academic program  ?Occupational History  ?  Comment: retired Charity fundraiser   ?Tobacco Use  ? Smoking status: Never  ? Smokeless tobacco: Never  ?Vaping Use  ? Vaping Use: Never used  ?Substance and Sexual Activity  ? Alcohol use: Not Currently  ? Drug use: Never  ? Sexual activity: Not on file  ?Other  Topics Concern  ? Not on file  ?Social History Narrative  ? 05/14/20 lives with spouse  ? Right handed   ? ?Social Determinants of Health  ? ?Financial Resource Strain: Not on file  ?Food Insecurity: Not on file  ?Transportation Needs: Not on file  ?Physical Activity: Not on file  ?Stress: Not on file  ?Social Connections: Not on file  ?  ? ?Family History: ?The patient's family history includes Alcoholism in her paternal grandfather; Cancer in her father; Dementia in her mother; Other in her maternal grandmother. There is no history  of Breast cancer. ? ?ROS:   ?Please see the history of present illness.    ? All other systems reviewed and are negative. ? ?EKGs/Labs/Other Studies Reviewed:   ? ?The following studies were reviewed today: ?Notes and labs from most recent PCP visit and neurology appointment ? ?ECG from PCP 09/30/2016 showed normal sinus rhythm but showed similar QRS morphology ? ?EKG:  EKG is ordered today.  The ekg ordered today demonstrates sinus rhythm with ventricular trigeminy, polyphasic QRS in leads V1-V2 but overall narrow QRS duration of 90 ms, left axis deviation due to left anterior fascicular block, no ischemic repolarization changes, normal QT C at 444 ms.  There is no evidence of preexcitation, Brugada pattern or epsilon wave. ? ?Recent Labs: ?11/25/2020: TSH 1.530  ?Recent Lipid Panel ?No results found for: CHOL, TRIG, HDL, CHOLHDL, VLDL, LDLCALC, LDLDIRECT ?01/07/2021 ?Creatinine 1.04, potassium 4.7, ALT 19 ?Cholesterol 136, HDL 73, LDL 145, triglycerides 103 ? ?Risk Assessment/Calculations:   ?  ? ?    ? ?Physical Exam:   ? ?VS:  BP 126/78 (BP Location: Left Arm, Patient Position: Sitting, Cuff Size: Normal)   Pulse 66   Ht 5\' 4"  (1.626 m)   Wt 161 lb 6.4 oz (73.2 kg)   SpO2 95%   BMI 27.70 kg/m?    ? ?Wt Readings from Last 3 Encounters:  ?09/10/21 161 lb 6.4 oz (73.2 kg)  ?09/02/21 164 lb (74.4 kg)  ?03/04/21 156 lb (70.8 kg)  ?  ? ?GEN: Appears relatively lean lean and fit, well nourished, well developed in no acute distress ?HEENT: Normal ?NECK: No JVD; No carotid bruits ?LYMPHATICS: No lymphadenopathy ?CARDIAC: RRR with occasional ectopy, no murmurs, rubs, gallops ?RESPIRATORY:  Clear to auscultation without rales, wheezing or rhonchi  ?ABDOMEN: Soft, non-tender, non-distended ?MUSCULOSKELETAL:  No edema; No deformity  ?SKIN: Warm and dry ?NEUROLOGIC:  Alert and oriented x 3 ?PSYCHIATRIC:  Normal affect  ? ?ASSESSMENT:   ? ?1. Frequent unifocal PVCs   ?2. Hypercholesterolemia   ? ?PLAN:   ? ?In order of  problems listed above: ? ?Frequent PVCs: These are monomorphic and with a morphology that strongly suggest outflow tract origin.  She does not have history of structural heart disease, family history of unexplained sudden death or symptoms of near syncope or syncope.  These are likely to be benign and do not need to be treated since they are asymptomatic.  They are causing artifactual bradycardia (a slow heart rate reported using automatic blood pressure cuff, pulse oximetry or pulse palpation should always be confirmed with an electrical tracing using her smart watch or a medical device).  We will check a treadmill stress test and an echocardiogram.  If we do not identify structural abnormalities or signs of ischemic heart disease, we may stop the work-up there.  I think the odds that her outflow tract PVCs are related to arrhythmogenic cardiomyopathy are very low due to  the age of onset.  She does have a peculiar QRS morphology in the septal leads, but there is no evidence of epsilon wave.  If the echocardiogram suggest structural abnormalities of the right ventricle with pursue a cardiac MRI. ?Hypercholesterolemia: This is mild and her LDL cholesterol is somewhat compensated by an excellent HDL.  If there is no evidence of ischemic heart disease, I do not think this needs to be treated.  In fact, with her new cognitive issues, a statin may actually be counterproductive and may cause confusion as to the cause of worsening function. ? ?   ? ?   ? ? ?Medication Adjustments/Labs and Tests Ordered: ?Current medicines are reviewed at length with the patient today.  Concerns regarding medicines are outlined above.  ?Orders Placed This Encounter  ?Procedures  ? Exercise Tolerance Test  ? EKG 12-Lead  ? ECHOCARDIOGRAM COMPLETE  ? ?No orders of the defined types were placed in this encounter. ? ? ?Patient Instructions  ?Medication Instructions:  ?No changes ?*If you need a refill on your cardiac medications before your  next appointment, please call your pharmacy* ? ? ?Lab Work: ?None ordered ?If you have labs (blood work) drawn today and your tests are completely normal, you will receive your results only by: ?MyChart Message (if

## 2021-09-17 ENCOUNTER — Ambulatory Visit (INDEPENDENT_AMBULATORY_CARE_PROVIDER_SITE_OTHER): Payer: Medicare Other

## 2021-09-17 DIAGNOSIS — I444 Left anterior fascicular block: Secondary | ICD-10-CM | POA: Diagnosis not present

## 2021-09-17 DIAGNOSIS — I493 Ventricular premature depolarization: Secondary | ICD-10-CM | POA: Diagnosis not present

## 2021-09-17 LAB — ECHOCARDIOGRAM COMPLETE
AR max vel: 3.28 cm2
AV Area VTI: 3.02 cm2
AV Area mean vel: 3.17 cm2
AV Mean grad: 2 mmHg
AV Peak grad: 4.4 mmHg
Ao pk vel: 1.05 m/s
Area-P 1/2: 2.96 cm2
Calc EF: 61.8 %
S' Lateral: 2.22 cm
Single Plane A2C EF: 62.8 %
Single Plane A4C EF: 61.8 %

## 2021-09-30 ENCOUNTER — Telehealth (HOSPITAL_COMMUNITY): Payer: Self-pay | Admitting: *Deleted

## 2021-09-30 NOTE — Telephone Encounter (Signed)
Close encounter 

## 2021-10-01 ENCOUNTER — Ambulatory Visit (HOSPITAL_COMMUNITY)
Admission: RE | Admit: 2021-10-01 | Discharge: 2021-10-01 | Disposition: A | Discharge status: Home / Self Care | Payer: Medicare Other | Source: Ambulatory Visit | Attending: Cardiovascular Disease | Admitting: Cardiovascular Disease

## 2021-10-01 DIAGNOSIS — I493 Ventricular premature depolarization: Secondary | ICD-10-CM | POA: Diagnosis not present

## 2021-10-01 LAB — EXERCISE TOLERANCE TEST
Angina Index: 0
Duke Treadmill Score: 11
Estimated workload: 13.4
Exercise duration (min): 11 min
Exercise duration (sec): 0 s
MPHR: 150 {beats}/min
Peak HR: 151 {beats}/min
Percent HR: 100 %
Rest HR: 70 {beats}/min
ST Depression (mm): 0 mm

## 2021-10-09 DIAGNOSIS — M79672 Pain in left foot: Secondary | ICD-10-CM | POA: Diagnosis not present

## 2021-10-09 DIAGNOSIS — S92352A Displaced fracture of fifth metatarsal bone, left foot, initial encounter for closed fracture: Secondary | ICD-10-CM | POA: Diagnosis not present

## 2021-10-20 DIAGNOSIS — M79672 Pain in left foot: Secondary | ICD-10-CM | POA: Diagnosis not present

## 2021-10-22 DIAGNOSIS — R21 Rash and other nonspecific skin eruption: Secondary | ICD-10-CM | POA: Diagnosis not present

## 2021-11-18 DIAGNOSIS — M79672 Pain in left foot: Secondary | ICD-10-CM | POA: Diagnosis not present

## 2021-12-16 DIAGNOSIS — S92352D Displaced fracture of fifth metatarsal bone, left foot, subsequent encounter for fracture with routine healing: Secondary | ICD-10-CM | POA: Diagnosis not present

## 2021-12-16 DIAGNOSIS — M79672 Pain in left foot: Secondary | ICD-10-CM | POA: Diagnosis not present

## 2022-01-08 DIAGNOSIS — N1831 Chronic kidney disease, stage 3a: Secondary | ICD-10-CM | POA: Diagnosis not present

## 2022-01-08 DIAGNOSIS — E7800 Pure hypercholesterolemia, unspecified: Secondary | ICD-10-CM | POA: Diagnosis not present

## 2022-01-13 ENCOUNTER — Other Ambulatory Visit: Payer: Self-pay | Admitting: Family Medicine

## 2022-01-13 DIAGNOSIS — E2839 Other primary ovarian failure: Secondary | ICD-10-CM

## 2022-01-13 DIAGNOSIS — Z139 Encounter for screening, unspecified: Secondary | ICD-10-CM

## 2022-01-13 DIAGNOSIS — Z0000 Encounter for general adult medical examination without abnormal findings: Secondary | ICD-10-CM | POA: Diagnosis not present

## 2022-01-13 DIAGNOSIS — N1831 Chronic kidney disease, stage 3a: Secondary | ICD-10-CM | POA: Diagnosis not present

## 2022-01-13 DIAGNOSIS — E7800 Pure hypercholesterolemia, unspecified: Secondary | ICD-10-CM | POA: Diagnosis not present

## 2022-02-03 ENCOUNTER — Telehealth: Payer: Self-pay | Admitting: Anesthesiology

## 2022-02-03 NOTE — Telephone Encounter (Signed)
Patient called requesting a refill on her Namenda 5 mg.

## 2022-02-03 NOTE — Telephone Encounter (Signed)
Patient advised Pharmacy  has 11 refills on file. Per patient she found another bottle after she called [place].

## 2022-03-05 ENCOUNTER — Ambulatory Visit: Payer: Medicare Other | Admitting: Physician Assistant

## 2022-03-05 ENCOUNTER — Encounter: Payer: Self-pay | Admitting: Physician Assistant

## 2022-03-05 VITALS — BP 116/72 | HR 72 | Resp 18 | Ht 63.0 in | Wt 171.0 lb

## 2022-03-05 DIAGNOSIS — G3184 Mild cognitive impairment, so stated: Secondary | ICD-10-CM

## 2022-03-05 DIAGNOSIS — R413 Other amnesia: Secondary | ICD-10-CM

## 2022-03-05 MED ORDER — MEMANTINE HCL 5 MG PO TABS: 5.0000 mg | ORAL_TABLET | Freq: Two times a day (BID) | ORAL | 11 refills | Status: DC

## 2022-03-05 NOTE — Patient Instructions (Signed)
It was a pleasure to see you today at our office.   Recommendations:  Follow up in 6 months Repeat the neurocognitive testing memantine  5mg  twice daily . Side effects discussed    RECOMMENDATIONS FOR ALL PATIENTS WITH MEMORY PROBLEMS: 1. Continue to exercise (Recommend 30 minutes of walking everyday, or 3 hours every week) 2. Increase social interactions - continue going to [place] and enjoy social gatherings with friends and family 3. Eat healthy, avoid fried foods and eat more fruits and vegetables 4. Maintain adequate blood pressure, blood sugar, and blood cholesterol level. Reducing the risk of stroke and cardiovascular disease also helps promoting better memory. 5. Avoid stressful situations. Live a simple life and avoid aggravations. Organize your time and prepare for the next day in anticipation. 6. Sleep well, avoid any interruptions of sleep and avoid any distractions in the bedroom that may interfere with adequate sleep quality 7. Avoid sugar, avoid sweets as there is a strong link between excessive sugar intake, diabetes, and cognitive impairment We discussed the Mediterranean diet, which has been shown to help patients reduce the risk of progressive memory disorders and reduces cardiovascular risk. This includes eating fish, eat fruits and green leafy vegetables, nuts like almonds and hazelnuts, walnuts, and also use olive oil. Avoid fast foods and fried foods as much as possible. Avoid sweets and sugar as sugar use has been linked to worsening of memory function.  There is always a concern of gradual progression of memory problems. If this is the case, then we may need to adjust level of care according to patient needs. Support, both to the patient and caregiver, should then be put into place.    FALL PRECAUTIONS: Be cautious when walking. Scan the area for obstacles that may increase the risk of trips and falls. When getting up in the mornings, sit up at the edge of the bed for a  few minutes before getting out of bed. Consider elevating the bed at the head end to avoid drop of blood pressure when getting up. Walk always in a well-lit room (use night lights in the walls). Avoid area rugs or power cords from appliances in the middle of the walkways. Use a walker or a cane if necessary and consider physical therapy for balance exercise. Get your eyesight checked regularly.  FINANCIAL OVERSIGHT: Supervision, especially oversight when making financial decisions or transactions is also recommended.  HOME SAFETY: Consider the safety of the kitchen when operating appliances like stoves, microwave oven, and blender. Consider having supervision and share cooking responsibilities until no longer able to participate in those. Accidents with firearms and other hazards in the house should be identified and addressed as well.   ABILITY TO BE LEFT ALONE: If patient is unable to contact 911 operator, consider using LifeLine, or when the need is there, arrange for someone to stay with patients. Smoking is a fire hazard, consider supervision or cessation. Risk of wandering should be assessed by caregiver and if detected at any point, supervision and safe proof recommendations should be instituted.  MEDICATION SUPERVISION: Inability to self-administer medication needs to be constantly addressed. Implement a mechanism to ensure safe administration of the medications.   DRIVING: Regarding driving, in patients with progressive memory problems, driving will be impaired. We advise to have someone else do the driving if trouble finding directions or if minor accidents are reported. Independent driving assessment is available to determine safety of driving.   If you are interested in the driving assessment, you can  contact the following:  [facility] [employer] [facility] in [place] [phone]  [facility] [phone]  [facility] [phone]  [facility]  [phone] or [phone]

## 2022-03-05 NOTE — Progress Notes (Signed)
Assessment/Plan:   Mild Cognitive Impairment   Megan Schultz is a very pleasant 71 y.o. RH femalee retired Counselling psychologist with a history of ADHD, family history of dementia, depression, hyperlipidemia, presenting today in follow-up for evaluation of memory loss. Patient is on memantine 5 mg daily. MMSE today is  26/30, with very slight decline compared to prior. Her son, who does not see her that often, reports greater changes than her husband, who see her daily .      Recommendations:   Follow up in 6  months. Continue to control mood as per PCP Recommend good control of cardiovascular risk factors.   Repeat Neuropsych testing in 1 year for clarity of diagnosis and disease trajectory   Increase memantine 5 mg bid. Side effects discussed      Subjective:   This patient is accompanied in the office by her son and her husband who supplements the history. Previous records as well as any outside records available were reviewed prior to todays visit. Last seen 09/02/2021.  Last MMSE 11/22/2020 was 26/30   Any changes in memory since last visit?  Husband and patient report that her memory is "About the same". But her visiting son reports some changes during the last 2- 3 months . Her STM may be more affected. LTM is normal  She does not do any crossword puzzles or word findings, she likes to play Mahjong and  watch [employer] Feud  repeats oneself?  Endorsed Disoriented when walking into a room?  Patient denies   Leaving objects in unusual places?  Patient denies   Ambulates  with difficulty?  Her son Megan Schultz walks with her 3-4 times a week. She broke her foot in 09/2021 with quick recovery now she is doing tennis and run and weights  Recent falls?  Patient denies   Any head injuries?  Patient denies   History of seizures?   Patient denies   Wandering behavior?  Patient denies   Patient drives?  Endorsed but has a little more apprehension when driving.   Any mood changes since last visit?   Patient denies . Her psych meds were adjusted and she is doing well.  Any worsening depression?:  Patient denies   Hallucinations?  Patient denies   Paranoia?  Patient denies   Patient reports that sleeps well without vivid dreams, REM behavior or sleepwalking   History of sleep apnea?  Patient denies   Any hygiene concerns?  Patient denies   Independent of bathing and dressing?  Endorsed  Does the patient needs help with medications? Patient in charge  Who is in charge of the finances?   Husband is in charge    Any changes in appetite?  Patient denies    Patient have trouble swallowing? Patient denies   Does the patient cook?  Patient denies   Any kitchen accidents such as leaving the stove on? Patient denies   Any headaches?  Patient denies   Double vision? Patient denies   Any focal numbness or tingling?  Patient denies   Chronic back pain Patient denies   Unilateral weakness?  Patient denies   Any tremors?  Patient denies   Any history of anosmia?  Patient denies   Any incontinence of urine?  Patient denies   Any bowel dysfunction?   Patient denies      Patient lives  with husband     Initial visit 11/22/20 The patient is seen in neurologic consultation at the request of  [name], MD for the evaluation of memory.  The patient is accompanied by son Megan Schultz who supplements the history. She is a 71 y.o. year old female who has had memory issues for about  3 years, Initially seen by Dr. [name] at [facility] on 05/14/20, diagnosed with MCI. MMSE  at the time was 28.  No medications were recommended at the time. Initially,her memory issues were noted by her son during the Summer of 2019 when she repeated the same conversation, not remembering she had talked about it before. She also asked similar questions. She adds"it's not uncommon for me to have 3 things in my mind, so I might forget I asked". She reports that the Covid pandemic has greatly affected her. Her relationship with her  husband is not good, and has been worse over the last 2 years, with " possibly an infidelity too"-son says. She does not do any activities without him, and other than enjoying her grandchildren and running with the other son daily, 1-2 miles a day, she is frequently bored. She cries often, afraid that she has dementia like her mother.  She has not yet seen a therapist or psychiatrist. Recently, the patient was started on Celexa, which does not seem to help, as the patient is taking it as needed rather than on a regimen.  She states that this makes her very sleepy, because she is taking it in the morning.  She sleeps about 8 hours, without vivid dreams or sleepwalking.  She denies hallucinations or paranoia.  She is independent of bathing and dressing.  Her husband has been always in charge of the finances, "is his thing".  She does not cook a lot, because her husband likes to go [employer] all the time, and her diet includes a lot of bread, and ice cream.  She has not been eating a healthy diet in years.  She does not take a lot of medications, but she is able to take it and not forget the doses.  She denies leaving objects in unusual places.  Her appetite is good, and denies any trouble swallowing.  The patient continues to drive, denies getting lost.  She denies any headaches, falls, injuries to the head except for a skiing accident 30 years ago, with neck pain x 5 years requiring a chiropractor, double vision, dizziness, focal numbness or tingling, unilateral weakness or tremors.  Denies urine incontinence or retention, constipation or diarrhea.  Denies anosmia.  Denies a history of OSA, alcohol or tobacco.  As mentioned before, her family history is remarkable for dementia in her mother, whom she took care of until she died.  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     MRI brain with and without contrast 06/20/20 1. No acute intracranial abnormality. 2. Hyperintense T2-weighted signal within the superior  cerebellar peduncles, posterior midbrain and left basal ganglia with mild superior cerebellar atrophy. Findings suggest Wernicke encephalopathy.   Neurocognitive testing 01/09/21 Dr. Nicole Kindred  "We discussed impression of mild cognitive impairment, with memory loss based on clinical history, as reflected in the patient instructions. I discussed possible etiologies including AD and wernicke-korsakoff, and we also discussed primary prevention, including MIND diet, exercise (she is already doing some), and the like. We discussed the importance of not catastrophic, and the difference between Alzheimer's disease, the clinical state of dementia, and mild cognitive impairment."    Labs 05/14/20 Vit B12 05/04/20  653 TSH 2.090 B1 123.1    Past Medical History:  Diagnosis Date   ADD (attention deficit disorder)    High cholesterol    Major depression in partial remission ([place])    Memory changes    Muscle spasm      Past Surgical History:  Procedure Laterality Date   CHOLECYSTECTOMY     FOOT SURGERY Right    SHOULDER ARTHROSCOPY       PREVIOUS MEDICATIONS:   CURRENT MEDICATIONS:  Outpatient Encounter Medications as of 03/05/2022  Medication Sig   B-Complex TABS daily in the afternoon.   citalopram (CELEXA) 20 MG tablet Take 20 mg by mouth daily in the afternoon.   memantine (NAMENDA) 5 MG tablet Take 1 tablet (5 mg total) by mouth daily.   cetirizine (ZYRTEC) 10 MG tablet Take 10 mg by mouth daily. (Patient not taking: Reported on 09/10/2021)   citalopram (CELEXA) 20 MG tablet Take 20 mg by mouth daily.   fluticasone (FLONASE) 50 MCG/ACT nasal spray Place 1 spray into both nostrils 2 (two) times daily. (Patient not taking: Reported on 09/10/2021)   hydrOXYzine (ATARAX) 25 MG tablet Take 25 mg by mouth at bedtime as needed. (Patient not taking: Reported on 09/10/2021)   No facility-administered encounter medications on file as of 03/05/2022.     Objective:     PHYSICAL EXAMINATION:     VITALS:   Vitals:   03/05/22 1528  BP: 116/72  Pulse: 72  Resp: 18  SpO2: 98%  Weight: 171 lb (77.6 kg)  Height: 5\' 3"  (1.6 m)    GEN:  The patient appears stated age and is in NAD. HEENT:  Normocephalic, atraumatic.   Neurological examination:  General: NAD, well-groomed, appears stated age. Orientation: The patient is alert. Oriented to person, place and not to year ([birth year]23) Cranial nerves: There is good facial symmetry.The speech is fluent and clear. No aphasia or dysarthria. Fund of knowledge is appropriate. Recent memory impaired and remote memory is normal.  Attention and concentration are normal.  Able to name objects and repeat phrases.  Hearing is intact to conversational tone.    Sensation: Sensation is intact to light touch throughout Motor: Strength is normal Tremors: none  DTR's 2/4 in UE/LE      11/22/2020   12:00 PM  Montreal Cognitive Assessment   Visuospatial/ Executive (0/5) 5  Naming (0/3) 3  Attention: Read list of digits (0/2) 2  Attention: Read list of letters (0/1) 1  Attention: Serial 7 subtraction starting at 100 (0/3) 3  Language: Repeat phrase (0/2) 2  Language : Fluency (0/1) 1  Abstraction (0/2) 2  Delayed Recall (0/5) 2  Orientation (0/6) 5  Total 26  Adjusted Score (based on education) 26       09/02/2021    9:00 AM 05/14/2020    3:13 PM  MMSE - Mini Mental State Exam  Orientation to time 5 5  Orientation to Place 5 5  Registration 3 3  Attention/ Calculation 5 3  Recall 0 3  Language- name 2 objects 2 2  Language- repeat 1 1  Language- follow 3 step command 3 3  Language- read & follow direction 1 1  Write a sentence 1 1  Copy design 1 1  Total score 27 28       Movement examination: Tone: There is normal tone in the UE/LE Abnormal movements:  no tremor.  No myoclonus.  No asterixis.   Coordination:  There is no decremation with RAM's. Gait and Station: The patient has no difficulty arising out of a  deep-seated chair  without the use of the hands. The patient's stride length is good.  Gait is cautious and narrow.   Thank you for allowing [place] the opportunity to participate in the care of this nice patient. Please do not hesitate to contact us for any questions or concerns.   Total time spent on today's visit was 38 minutes dedicated to this patient today, preparing to see patient, examining the patient, ordering tests and/or medications and counseling the patient, documenting clinical information in the EHR or other health record, independently interpreting results and communicating results to the patient/family, discussing treatment and goals, answering patient's questions and coordinating care.  Cc:  [name], MD  Sharene Butters 03/05/2022 3:50 PM

## 2022-03-06 DIAGNOSIS — M79671 Pain in right foot: Secondary | ICD-10-CM | POA: Diagnosis not present

## 2022-06-04 ENCOUNTER — Ambulatory Visit: Payer: Medicare Other | Admitting: Physician Assistant

## 2022-06-04 ENCOUNTER — Encounter: Payer: Self-pay | Admitting: Physician Assistant

## 2022-06-04 VITALS — BP 124/63 | HR 72 | Resp 20 | Ht 63.0 in | Wt 169.0 lb

## 2022-06-04 DIAGNOSIS — G3184 Mild cognitive impairment, so stated: Secondary | ICD-10-CM | POA: Diagnosis not present

## 2022-06-04 MED ORDER — MEMANTINE HCL 5 MG PO TABS: 5.0000 mg | ORAL_TABLET | Freq: Two times a day (BID) | ORAL | 3 refills | Status: DC

## 2022-06-04 MED ORDER — MEMANTINE HCL 10 MG PO TABS: 10.0000 mg | ORAL_TABLET | Freq: Two times a day (BID) | ORAL | 3 refills | Status: DC

## 2022-06-04 MED ORDER — MEMANTINE HCL 5 MG PO TABS: 5.0000 mg | ORAL_TABLET | Freq: Two times a day (BID) | ORAL | 11 refills | Status: DC

## 2022-06-04 NOTE — Progress Notes (Addendum)
Assessment/Plan:   Mild Cognitive Impairment of Unclear Etiology   Megan Schultz is a very pleasant 72 y.o. RH female  retired Airline pilot, with a history of ADHD, depression HLD presenting today in follow-up for evaluation of memory loss. Patient is on memantine 5mg  bid. MMSE today is 25/30, slight decline in her delayed recall. She remains very active and able to perform most ADLs without difficulty.       Recommendations:   Follow up in 6  months. Increase memantine to 10 mg bid. Side effects discussed Patient scheduled for Neuropsych evaluation on 12/2022 Follow up after testing.  Continue to control mood as per PCP Recommend good control of cardiovascular risk factors.       Subjective:   This patient is accompanied in the office by her husband   who supplements the history. Previous records as well as any outside records available were reviewed prior to todays visit.   Patient was last seen on 03/05/2022     Any changes in memory since last visit? "It is the same ". She may  forget recent  conversations and names. She likes to reads the [employer]. She likes to play Mahjong and watch some TV games.. She does not do crosswords.  repeats oneself?  Endorsed Disoriented when walking into a room?  Patient denies  Leaving objects in unusual places?  Patient denies   Wandering behavior?  Denies   Any personality changes since last visit?   denies   Any worsening depression?: denies   Hallucinations or paranoia?  denies   Seizures?   denies    Any sleep changes?  Sleeps well .Denies  vivid dreams, REM behavior or sleepwalking   Sleep apnea?   denies   Any hygiene concerns?   denies   Independent of bathing and dressing?  Endorsed  Does the patient needs help with medications? Patient is in charge . She ran out of memantine and forgot to pick up a  last refill  Who is in charge of the finances?  Husband does, but patient assists    Any changes in appetite?  denies      Patient have trouble swallowing?  denies   Does the patient cook? No   Any headaches?    denies   Vision changes? denies Chronic back pain  denies   Ambulates with difficulty?    denies   Recent falls or head injuries?    denies     Unilateral weakness, numbness or tingling?   denies   Any tremors?  denies   Any anosmia?    denies   Any incontinence of urine? Wears pads, may have some urgency Any bowel dysfunction?  denies      Patient lives  with husband Does the patient drive?There was one episode where she became lost. "Coming home, took the right instead of the L and he had to get  me"   Initial visit 11/22/20 The patient is seen in neurologic consultation at the request of Megan Cruel, MD for the evaluation of memory.  The patient is accompanied by son Megan Schultz who supplements the history. She is a 72 y.o. year old female who has had memory issues for about  3 years, Initially seen by Megan Schultz at [facility] on 05/14/20, diagnosed with MCI. MMSE  at the time was 28.  No medications were recommended at the time. Initially,her memory issues were noted by her son during the Summer of 2019 when she repeated  the same conversation, not remembering she had talked about it before. She also asked similar questions. She adds"it's not uncommon for me to have 3 things in my mind, so I might forget I asked". She reports that the Covid pandemic has greatly affected her. Her relationship with her husband is not good, and has been worse over the last 2 years, with " possibly an infidelity too"-son says. She does not do any activities without him, and other than enjoying her grandchildren and running with the other son daily, 1-2 miles a day, she is frequently bored. She cries often, afraid that she has dementia like her mother.  She has not yet seen a therapist or psychiatrist. Recently, the patient was started on Celexa, which does not seem to help, as the patient is taking it as needed rather than on a  regimen.  She states that this makes her very sleepy, because she is taking it in the morning.  She sleeps about 8 hours, without vivid dreams or sleepwalking.  She denies hallucinations or paranoia.  She is independent of bathing and dressing.  Her husband has been always in charge of the finances, "is his thing".  She does not cook a lot, because her husband likes to go [employer] all the time, and her diet includes a lot of bread, and ice cream.  She has not been eating a healthy diet in years.  She does not take a lot of medications, but she is able to take it and not forget the doses.  She denies leaving objects in unusual places.  Her appetite is good, and denies any trouble swallowing.  The patient continues to drive, denies getting lost.  She denies any headaches, falls, injuries to the head except for a skiing accident 30 years ago, with neck pain x 5 years requiring a chiropractor, double vision, dizziness, focal numbness or tingling, unilateral weakness or tremors.  Denies urine incontinence or retention, constipation or diarrhea.  Denies anosmia.  Denies a history of OSA, alcohol or tobacco.  As mentioned before, her family history is remarkable for dementia in her mother, whom she took care of until she died.  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     MRI brain with and without contrast 06/20/20 1. No acute intracranial abnormality. 2. Hyperintense T2-weighted signal within the superior cerebellar peduncles, posterior midbrain and left basal ganglia with mild superior cerebellar atrophy. Findings suggest Wernicke encephalopathy.   Neurocognitive testing 01/09/21 Megan Schultz  "We discussed impression of mild cognitive impairment, with memory loss based on clinical history, as reflected in the patient instructions. I discussed possible etiologies including AD and wernicke-korsakoff, and we also discussed primary prevention, including MIND diet, exercise (she is already doing some), and the like. We  discussed the importance of not catastrophic, and the difference between Alzheimer's disease, the clinical state of dementia, and mild cognitive impairment."    Labs 05/14/20 Vit B12 05/04/20  653 TSH 2.090 B1 123.1   Past Medical History:  Diagnosis Date   ADD (attention deficit disorder)    High cholesterol    Major depression in partial remission ([place])    Memory changes    Muscle spasm      Past Surgical History:  Procedure Laterality Date   CHOLECYSTECTOMY     FOOT SURGERY Right    SHOULDER ARTHROSCOPY       PREVIOUS MEDICATIONS:   CURRENT MEDICATIONS:  Outpatient Encounter Medications as of 06/04/2022  Medication Sig  B-Complex TABS daily in the afternoon.   citalopram (CELEXA) 20 MG tablet Take 20 mg by mouth daily.   citalopram (CELEXA) 20 MG tablet Take 20 mg by mouth daily in the afternoon.   fluticasone (FLONASE) 50 MCG/ACT nasal spray Place 1 spray into both nostrils 2 (two) times daily.   [DISCONTINUED] memantine (NAMENDA) 5 MG tablet Take 1 tablet (5 mg total) by mouth 2 (two) times daily.   cetirizine (ZYRTEC) 10 MG tablet Take 10 mg by mouth daily. (Patient not taking: Reported on 09/10/2021)   hydrOXYzine (ATARAX) 25 MG tablet Take 25 mg by mouth at bedtime as needed. (Patient not taking: Reported on 09/10/2021)   memantine (NAMENDA) 10 MG tablet Take 1 tablet (10 mg total) by mouth 2 (two) times daily.   [DISCONTINUED] memantine (NAMENDA) 5 MG tablet Take 1 tablet (5 mg total) by mouth 2 (two) times daily.   [DISCONTINUED] memantine (NAMENDA) 5 MG tablet Take 1 tablet (5 mg total) by mouth 2 (two) times daily.   No facility-administered encounter medications on file as of 06/04/2022.     Objective:     PHYSICAL EXAMINATION:    VITALS:   Vitals:   06/04/22 1523  BP: 124/63  Pulse: 72  Resp: 20  SpO2: 96%  Weight: 169 lb (76.7 kg)  Height: 5\' 3"  (1.6 m)    GEN:  The patient appears stated age and is in NAD. HEENT:  Normocephalic, atraumatic.    Neurological examination:  General: NAD, well-groomed, appears stated age. Orientation: The patient is alert. Oriented to person, place and not the year (2022) Cranial nerves: There is good facial symmetry.The speech is fluent and clear. No aphasia or dysarthria. Fund of knowledge is appropriate. Recent memory impaired and remote memory is normal.  She may become distracted at times, but  concentration is norma. Able to name objects and repeat phrases.  Hearing is intact to conversational tone.   Delayed recall 0/3   Sensation: Sensation is intact to light touch throughout Motor: Strength is at least antigravity x4. Tremors: none  DTR's 2/4 in UE/LE      11/22/2020   12:00 PM  Montreal Cognitive Assessment   Visuospatial/ Executive (0/5) 5  Naming (0/3) 3  Attention: Read list of digits (0/2) 2  Attention: Read list of letters (0/1) 1  Attention: Serial 7 subtraction starting at 100 (0/3) 3  Language: Repeat phrase (0/2) 2  Language : Fluency (0/1) 1  Abstraction (0/2) 2  Delayed Recall (0/5) 2  Orientation (0/6) 5  Total 26  Adjusted Score (based on education) 26       06/04/2022    4:00 PM 03/05/2022    5:00 PM 09/02/2021    9:00 AM  MMSE - Mini Mental State Exam  Orientation to time 4 3 5   Orientation to Place 4 5 5   Registration 3 3 3   Attention/ Calculation 5 5 5   Recall 0 3 0  Language- name 2 objects 2 2 2   Language- repeat 1 1 1   Language- follow 3 step command 3 1 3   Language- read & follow direction 1 1 1   Write a sentence 1 1 1   Copy design 1 1 1   Total score 25 26 27        Movement examination: Tone: There is normal tone in the UE/LE Abnormal movements:  no tremor.  No myoclonus.  No asterixis.   Coordination:  There is no decremation with RAM's. Normal finger to nose  Gait and Station:  The patient has no difficulty arising out of a deep-seated chair without the use of the hands. The patient's stride length is good.  Gait is cautious and narrow.   Thank  you for allowing [place] the opportunity to participate in the care of this nice patient. Please do not hesitate to contact us for any questions or concerns.   Total time spent on today's visit was 31 minutes dedicated to this patient today, preparing to see patient, examining the patient, ordering tests and/or medications and counseling the patient, documenting clinical information in the EHR or other health record, independently interpreting results and communicating results to the patient/family, discussing treatment and goals, answering patient's questions and coordinating care.  Cc:  [name], MD  [name] 06/07/2022 12:41 PM

## 2022-06-04 NOTE — Patient Instructions (Signed)
It was a pleasure to see you today at our office.   Recommendations:  Follow up after the neuropsych evaluation  Repeat the neurocognitive testing Memantine 10 mg twice daily . Side effects discussed    RECOMMENDATIONS FOR ALL PATIENTS WITH MEMORY PROBLEMS: 1. Continue to exercise (Recommend 30 minutes of walking everyday, or 3 hours every week) 2. Increase social interactions - continue going to [place] and enjoy social gatherings with friends and family 3. Eat healthy, avoid fried foods and eat more fruits and vegetables 4. Maintain adequate blood pressure, blood sugar, and blood cholesterol level. Reducing the risk of stroke and cardiovascular disease also helps promoting better memory. 5. Avoid stressful situations. Live a simple life and avoid aggravations. Organize your time and prepare for the next day in anticipation. 6. Sleep well, avoid any interruptions of sleep and avoid any distractions in the bedroom that may interfere with adequate sleep quality 7. Avoid sugar, avoid sweets as there is a strong link between excessive sugar intake, diabetes, and cognitive impairment We discussed the Mediterranean diet, which has been shown to help patients reduce the risk of progressive memory disorders and reduces cardiovascular risk. This includes eating fish, eat fruits and green leafy vegetables, nuts like almonds and hazelnuts, walnuts, and also use olive oil. Avoid fast foods and fried foods as much as possible. Avoid sweets and sugar as sugar use has been linked to worsening of memory function.  There is always a concern of gradual progression of memory problems. If this is the case, then we may need to adjust level of care according to patient needs. Support, both to the patient and caregiver, should then be put into place.    FALL PRECAUTIONS: Be cautious when walking. Scan the area for obstacles that may increase the risk of trips and falls. When getting up in the mornings, sit up at the  edge of the bed for a few minutes before getting out of bed. Consider elevating the bed at the head end to avoid drop of blood pressure when getting up. Walk always in a well-lit room (use night lights in the walls). Avoid area rugs or power cords from appliances in the middle of the walkways. Use a walker or a cane if necessary and consider physical therapy for balance exercise. Get your eyesight checked regularly.  FINANCIAL OVERSIGHT: Supervision, especially oversight when making financial decisions or transactions is also recommended.  HOME SAFETY: Consider the safety of the kitchen when operating appliances like stoves, microwave oven, and blender. Consider having supervision and share cooking responsibilities until no longer able to participate in those. Accidents with firearms and other hazards in the house should be identified and addressed as well.   ABILITY TO BE LEFT ALONE: If patient is unable to contact 911 operator, consider using LifeLine, or when the need is there, arrange for someone to stay with patients. Smoking is a fire hazard, consider supervision or cessation. Risk of wandering should be assessed by caregiver and if detected at any point, supervision and safe proof recommendations should be instituted.  MEDICATION SUPERVISION: Inability to self-administer medication needs to be constantly addressed. Implement a mechanism to ensure safe administration of the medications.   DRIVING: Regarding driving, in patients with progressive memory problems, driving will be impaired. We advise to have someone else do the driving if trouble finding directions or if minor accidents are reported. Independent driving assessment is available to determine safety of driving.   If you are interested in the driving assessment,  you can contact the following:  [employer] in [place]  [place] [phone]  [place]  [facility] [phone] or [phone]

## 2022-08-19 DIAGNOSIS — H35032 Hypertensive retinopathy, left eye: Secondary | ICD-10-CM | POA: Diagnosis not present

## 2022-08-19 DIAGNOSIS — H40013 Open angle with borderline findings, low risk, bilateral: Secondary | ICD-10-CM | POA: Diagnosis not present

## 2022-08-19 DIAGNOSIS — H04123 Dry eye syndrome of bilateral lacrimal glands: Secondary | ICD-10-CM | POA: Diagnosis not present

## 2022-08-19 DIAGNOSIS — H524 Presbyopia: Secondary | ICD-10-CM | POA: Diagnosis not present

## 2022-08-19 DIAGNOSIS — H25813 Combined forms of age-related cataract, bilateral: Secondary | ICD-10-CM | POA: Diagnosis not present

## 2023-01-05 ENCOUNTER — Encounter: Payer: Medicare Other | Admitting: Psychology

## 2023-01-12 ENCOUNTER — Encounter: Payer: Medicare Other | Admitting: Psychology

## 2023-01-15 ENCOUNTER — Ambulatory Visit: Payer: Medicare Other | Admitting: Physician Assistant

## 2023-01-15 ENCOUNTER — Other Ambulatory Visit: Payer: Medicare Other

## 2023-01-15 ENCOUNTER — Encounter: Payer: Self-pay | Admitting: Physician Assistant

## 2023-01-15 VITALS — BP 103/52 | HR 55 | Resp 18 | Ht 63.0 in | Wt 169.0 lb

## 2023-01-15 DIAGNOSIS — R413 Other amnesia: Secondary | ICD-10-CM

## 2023-01-15 DIAGNOSIS — G3184 Mild cognitive impairment, so stated: Secondary | ICD-10-CM | POA: Diagnosis not present

## 2023-01-15 MED ORDER — MEMANTINE HCL 10 MG PO TABS: 10.0000 mg | ORAL_TABLET | Freq: Two times a day (BID) | ORAL | 3 refills | Status: DC

## 2023-01-15 NOTE — Patient Instructions (Addendum)
It was a pleasure to see you today at our office.   Recommendations:  Neurocognitive evaluation at our office   MRI of the brain, the radiology office will call you to arrange you appointment   Check labs today Continue Memantine 10 mg twice daily. Side effects were discussed    Follow up in 6  months   For psychiatric meds, mood meds: Please have your primary care physician manage these medications.  If you have any severe symptoms of a stroke, or other severe issues such as confusion,severe chills or fever, etc call 911 or go to the ER as you may need to be evaluated further  For guidance regarding [facility] and if placement were needed at the facility, contact Social Worker tel: [phone]  For assessment of decision of mental capacity and competency:  Call Dr. Erick Blinks, geriatric psychiatrist at [phone]  Counseling regarding caregiver distress, including caregiver depression, anxiety and issues regarding community resources, adult day care programs, adult living facilities, or memory care questions:  please contact your  Primary Doctor's Social Worker   Whom to call: Memory  decline, memory medications: Call our office [phone]    [email or web address]   RECOMMENDATIONS FOR ALL PATIENTS WITH MEMORY PROBLEMS: 1. Continue to exercise (Recommend 30 minutes of walking everyday, or 3 hours every week) 2. Increase social interactions - continue going to [place] and enjoy social gatherings with friends and family 3. Eat healthy, avoid fried foods and eat more fruits and vegetables 4. Maintain adequate blood pressure, blood sugar, and blood cholesterol level. Reducing the risk of stroke and cardiovascular disease also helps promoting better memory. 5. Avoid stressful situations. Live a simple life and avoid aggravations. Organize your time and prepare for the next day in anticipation. 6. Sleep well,  avoid any interruptions of sleep and avoid any distractions in the bedroom that may interfere with adequate sleep quality 7. Avoid sugar, avoid sweets as there is a strong link between excessive sugar intake, diabetes, and cognitive impairment We discussed the Mediterranean diet, which has been shown to help patients reduce the risk of progressive memory disorders and reduces cardiovascular risk. This includes eating fish, eat fruits and green leafy vegetables, nuts like almonds and hazelnuts, walnuts, and also use olive oil. Avoid fast foods and fried foods as much as possible. Avoid sweets and sugar as sugar use has been linked to worsening of memory function.  There is always a concern of gradual progression of memory problems. If this is the case, then we may need to adjust level of care according to patient needs. Support, both to the patient and caregiver, should then be put into place.      You have been referred for a neuropsychological evaluation (i.e., evaluation of memory and thinking abilities). Please bring someone with you to this appointment if possible, as it is helpful for the doctor to hear from both you and another adult who knows you well. Please bring eyeglasses and hearing aids if you wear them.    The evaluation will take approximately 3 hours and has two parts:   The first part is a clinical interview with the neuropsychologist (Dr. Milbert Coulter or Dr. Roseanne Reno). During the interview, the neuropsychologist will speak with you and the individual you brought to the appointment.    The second part of the evaluation is testing with the doctor's technician Annabelle Harman or Selena Batten). During the testing, the technician will ask you to remember different types of material, solve problems, and  answer some questionnaires. Your family member will not be present for this portion of the evaluation.   Please note: We must reserve several hours of the neuropsychologist's time and the psychometrician's time for  your evaluation appointment. As such, there is a No-Show fee of $100. If you are unable to attend any of your appointments, please contact our office as soon as possible to reschedule.      DRIVING: Regarding driving, in patients with progressive memory problems, driving will be impaired. We advise to have someone else do the driving if trouble finding directions or if minor accidents are reported. Independent driving assessment is available to determine safety of driving.   If you are interested in the driving assessment, you can contact the following:  [facility] [employer] [facility] in [place] [phone]  [facility] [phone]  [facility] [phone]  [facility] [phone] or [phone]   FALL PRECAUTIONS: Be cautious when walking. Scan the area for obstacles that may increase the risk of trips and falls. When getting up in the mornings, sit up at the edge of the bed for a few minutes before getting out of bed. Consider elevating the bed at the head end to avoid drop of blood pressure when getting up. Walk always in a well-lit room (use night lights in the walls). Avoid area rugs or power cords from appliances in the middle of the walkways. Use a walker or a cane if necessary and consider physical therapy for balance exercise. Get your eyesight checked regularly.  FINANCIAL OVERSIGHT: Supervision, especially oversight when making financial decisions or transactions is also recommended.  HOME SAFETY: Consider the safety of the kitchen when operating appliances like stoves, microwave oven, and blender. Consider having supervision and share cooking responsibilities until no longer able to participate in those. Accidents with firearms and other hazards in the house should be identified and addressed as well.   ABILITY TO BE LEFT ALONE: If patient is unable to contact 911 operator, consider using LifeLine, or when the need is there, arrange for  someone to stay with patients. Smoking is a fire hazard, consider supervision or cessation. Risk of wandering should be assessed by caregiver and if detected at any point, supervision and safe proof recommendations should be instituted.  MEDICATION SUPERVISION: Inability to self-administer medication needs to be constantly addressed. Implement a mechanism to ensure safe administration of the medications.      Mediterranean Diet A Mediterranean diet refers to food and lifestyle choices that are based on the traditions of countries located on the [employer] [place]. This way of eating has been shown to help prevent certain conditions and improve outcomes for people who have chronic diseases, like kidney disease and heart disease. What are tips for following this plan? Lifestyle  Cook and eat meals together with your family, when possible. Drink enough fluid to keep your urine clear or pale yellow. Be physically active every day. This includes: Aerobic exercise like running or swimming. Leisure activities like gardening, walking, or housework. Get 7-8 hours of sleep each night. If recommended by your health care provider, drink red wine in moderation. This means 1 glass a day for nonpregnant women and 2 glasses a day for men. A glass of wine equals 5 oz (150 mL). Reading food labels  Check the serving size of packaged foods. For foods such as rice and pasta, the serving size refers to the amount of cooked product, not dry. Check the total fat in packaged foods. Avoid foods that  have saturated fat or trans fats. Check the ingredients list for added sugars, such as corn syrup. Shopping  At the grocery store, buy most of your food from the areas near the walls of the store. This includes: Fresh fruits and vegetables (produce). Grains, beans, nuts, and seeds. Some of these may be available in unpackaged forms or large amounts (in bulk). Fresh seafood. Poultry and eggs. Low-fat dairy  products. Buy whole ingredients instead of prepackaged foods. Buy fresh fruits and vegetables in-season from local farmers markets. Buy frozen fruits and vegetables in resealable bags. If you do not have access to quality fresh seafood, buy precooked frozen shrimp or canned fish, such as tuna, salmon, or sardines. Buy small amounts of raw or cooked vegetables, salads, or olives from the deli or salad bar at your store. Stock your pantry so you always have certain foods on hand, such as olive oil, canned tuna, canned tomatoes, rice, pasta, and beans. Cooking  Cook foods with extra-virgin olive oil instead of using butter or other vegetable oils. Have meat as a side dish, and have vegetables or grains as your main dish. This means having meat in small portions or adding small amounts of meat to foods like pasta or stew. Use beans or vegetables instead of meat in common dishes like chili or lasagna. Experiment with different cooking methods. Try roasting or broiling vegetables instead of steaming or sauteing them. Add frozen vegetables to soups, stews, pasta, or rice. Add nuts or seeds for added healthy fat at each meal. You can add these to yogurt, salads, or vegetable dishes. Marinate fish or vegetables using olive oil, lemon juice, garlic, and fresh herbs. Meal planning  Plan to eat 1 vegetarian meal one day each week. Try to work up to 2 vegetarian meals, if possible. Eat seafood 2 or more times a week. Have healthy snacks readily available, such as: Vegetable sticks with hummus. Greek yogurt. Fruit and nut trail mix. Eat balanced meals throughout the week. This includes: Fruit: 2-3 servings a day Vegetables: 4-5 servings a day Low-fat dairy: 2 servings a day Fish, poultry, or lean meat: 1 serving a day Beans and legumes: 2 or more servings a week Nuts and seeds: 1-2 servings a day Whole grains: 6-8 servings a day Extra-virgin olive oil: 3-4 servings a day Limit red meat and sweets  to only a few servings a month What are my food choices? Mediterranean diet Recommended Grains: Whole-grain pasta. Brown rice. Bulgar wheat. Polenta. Couscous. Whole-wheat bread. Orpah Cobb. Vegetables: Artichokes. Beets. Broccoli. Cabbage. Carrots. Eggplant. Green beans. Chard. Kale. Spinach. Onions. Leeks. Peas. Squash. Tomatoes. Peppers. Radishes. Fruits: Apples. Apricots. Avocado. Berries. Bananas. Cherries. Dates. Figs. Grapes. Lemons. Melon. Oranges. Peaches. Plums. Pomegranate. Meats and other protein foods: Beans. Almonds. Sunflower seeds. Pine nuts. Peanuts. Cod. Salmon. Scallops. Shrimp. Tuna. Tilapia. Clams. Oysters. Eggs. Dairy: Low-fat milk. Cheese. Greek yogurt. Beverages: Water. Red wine. Herbal tea. Fats and oils: Extra virgin olive oil. Avocado oil. Grape seed oil. Sweets and desserts: [place] yogurt with honey. Baked apples. Poached pears. Trail mix. Seasoning and other foods: Basil. Cilantro. Coriander. Cumin. Mint. Parsley. Sage. Rosemary. Tarragon. Garlic. Oregano. Thyme. Pepper. Balsalmic vinegar. Tahini. Hummus. Tomato sauce. Olives. Mushrooms. Limit these Grains: Prepackaged pasta or rice dishes. Prepackaged cereal with added sugar. Vegetables: Deep fried potatoes (french fries). Fruits: Fruit canned in syrup. Meats and other protein foods: Beef. Pork. Lamb. Poultry with skin. Hot dogs. Tomasa Blase. Dairy: Ice cream. Sour cream. Whole milk. Beverages: Juice. Sugar-sweetened soft drinks. Beer.  Liquor and spirits. Fats and oils: Butter. Canola oil. Vegetable oil. Beef fat (tallow). Lard. Sweets and desserts: Cookies. Cakes. Pies. Candy. Seasoning and other foods: Mayonnaise. Premade sauces and marinades. The items listed may not be a complete list. Talk with your dietitian about what dietary choices are right for you. Summary The Mediterranean diet includes both food and lifestyle choices. Eat a variety of fresh fruits and vegetables, beans, nuts, seeds, and whole  grains. Limit the amount of red meat and sweets that you eat. Talk with your health care provider about whether it is safe for you to drink red wine in moderation. This means 1 glass a day for nonpregnant women and 2 glasses a day for men. A glass of wine equals 5 oz (150 mL). This information is not intended to replace advice given to you by your health care provider. Make sure you discuss any questions you have with your health care provider. Document Released: 12/12/2015 Document Revised: 01/14/2016 Document Reviewed: 12/12/2015 Elsevier Interactive Patient Education  2017 [employer].

## 2023-01-15 NOTE — Progress Notes (Signed)
Assessment/Plan:   Amnestic MCI  Megan Schultz is a very pleasant 72 y.o. 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 Patient is on memantine 10 mg daily.  MMSE today is 21/30, slight decline since 6 months prior.  The patient was a neuropsychological evaluation on March 2025, but she canceled the appointment.  She denied it during the visit, but after investigation with scheduling and management, it was concluded that it was actually the patient canceled the appointment.  She was instructed for her to attend this visit, for diagnostic clarity.      Recommendations:   Follow up in 6 months after neurocognitive testing. Schedule neurocognitive testing for clarity of the diagnosis and to determine other causes of memory loss. Continue Memantine 10 mg twice daily. Side effects were discussed  Recommend good control of cardiovascular risk factors Continue to control mood as per PCP    Subjective:   This patient is accompanied in the office by her husband and son who supplements the history. Previous records as well as any outside records available were reviewed prior to todays visit.   Patient was last seen on 06/04/22 with MMSE 25/30.    Any changes in memory since last visit? "It is better"-husband says, but patient says that "it gets to you, wears you ". She enjoys reading the Scripts, play Majong, watch some TV games. Forgets details but after a while, some of these details come back.  Anxiety may hinder some of her responses, also she needs more time to think ". repeats oneself?  Endorsed Disoriented when walking into a room?  Patient denies    Leaving objects in unusual places?  Patient denies   Wandering behavior?   denies   Any personality changes since last visit?  Denies. Any worsening depression?: denies   Hallucinations or paranoia?  denies   Seizures?   denies    Any sleep  changes? Sleeps well.  Denies vivid dreams, REM behavior or sleepwalking   Sleep apnea?   denies. Any hygiene concerns?   Denies Independent of bathing and dressing?  Endorsed  Does the patient needs help with medications? Patient is in charge   Who is in charge of the finances?  Husband is in charge Any changes in appetite?  denies. Patient have trouble swallowing?  denies   Does the patient cook?  Any kitchen accidents such as leaving the stove on?   denies   Any headaches?    denies   Vision changes? denies Chronic pain?  denies   Ambulates with difficulty?  Runs with her son several times a week.   Recent falls or head injuries?    denies      Unilateral weakness, numbness or tingling?   denies   Any tremors?  denies   Any anosmia?    denies   Any incontinence of urine? Some urgency, uses pads  Any bowel dysfunction?  denies      Patient lives with her husband Does the patient drive?  Denies any issues but has been driving less thanbefore   Initial visit 11/22/20 The patient is seen in neurologic consultation at the request of Megan Floro, MD for the evaluation of memory.  The patient is accompanied by son Megan Schultz who supplements the history. She is a 72 y.o. year old female who has had memory issues for about  3 years, Initially seen by Dr.  Penumalli at [facility] on 05/14/20, diagnosed with MCI. MMSE  at the time was 28.  No medications were recommended at the time. Initially,her memory issues were noted by her son during the Summer of 2019 when she repeated the same conversation, not remembering she had talked about it before. She also asked similar questions. She adds"it's not uncommon for me to have 3 things in my mind, so I might forget I asked". She reports that the Covid pandemic has greatly affected her. Her relationship with her husband is not good, and has been worse over the last 2 years, with " possibly an infidelity too"-son says. She does not do any activities without him, and  other than enjoying her grandchildren and running with the other son daily, 1-2 miles a day, she is frequently bored. She cries often, afraid that she has dementia like her mother.  She has not yet seen a therapist or psychiatrist. Recently, the patient was started on Celexa, which does not seem to help, as the patient is taking it as needed rather than on a regimen.  She states that this makes her very sleepy, because she is taking it in the morning.  She sleeps about 8 hours, without vivid dreams or sleepwalking.  She denies hallucinations or paranoia.  She is independent of bathing and dressing.  Her husband has been always in charge of the finances, "is his thing".  She does not cook a lot, because her husband likes to go [employer] all the time, and her diet includes a lot of bread, and ice cream.  She has not been eating a healthy diet in years.  She does not take a lot of medications, but she is able to take it and not forget the doses.  She denies leaving objects in unusual places.  Her appetite is good, and denies any trouble swallowing.  The patient continues to drive, denies getting lost.  She denies any headaches, falls, injuries to the head except for a skiing accident 30 years ago, with neck pain x 5 years requiring a chiropractor, double vision, dizziness, focal numbness or tingling, unilateral weakness or tremors.  Denies urine incontinence or retention, constipation or diarrhea.  Denies anosmia.  Denies a history of OSA, alcohol or tobacco.  As mentioned before, her family history is remarkable for dementia in her mother, whom she took care of until she died.  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  MRI brain with and without contrast 06/20/20 1. No acute intracranial abnormality. 2. Hyperintense T2-weighted signal within the superior cerebellar peduncles, posterior midbrain and left basal ganglia with mild superior cerebellar atrophy. Findings suggest Wernicke encephalopathy.   Neurocognitive  testing 01/09/21 Megan Schultz  "We discussed impression of mild cognitive impairment, with memory loss based on clinical history, as reflected in the patient instructions. I discussed possible etiologies including AD and wernicke-korsakoff, and we also discussed primary prevention, including MIND diet, exercise (she is already doing some), and the like. We discussed the importance of not catastrophic, and the difference between Alzheimer's disease, the clinical state of dementia, and mild cognitive impairment."    Labs 05/14/20 Vit B12 05/04/20  653 TSH 2.090 B1 123.1    Past Medical History:  Diagnosis Date   ADD (attention deficit disorder)    High cholesterol    Major depression in partial remission (HCC)    Memory changes    Muscle spasm      Past Surgical History:  Procedure Laterality Date  CHOLECYSTECTOMY     FOOT SURGERY Right    SHOULDER ARTHROSCOPY       PREVIOUS MEDICATIONS:   CURRENT MEDICATIONS:  Outpatient Encounter Medications as of 01/15/2023  Medication Sig   B-Complex TABS daily in the afternoon.   citalopram (CELEXA) 20 MG tablet Take 20 mg by mouth daily.   citalopram (CELEXA) 20 MG tablet Take 20 mg by mouth daily in the afternoon.   fluticasone (FLONASE) 50 MCG/ACT nasal spray Place 1 spray into both nostrils 2 (two) times daily.   [DISCONTINUED] memantine (NAMENDA) 10 MG tablet Take 1 tablet (10 mg total) by mouth 2 (two) times daily.   cetirizine (ZYRTEC) 10 MG tablet Take 10 mg by mouth daily. (Patient not taking: Reported on 09/10/2021)   hydrOXYzine (ATARAX) 25 MG tablet Take 25 mg by mouth at bedtime as needed. (Patient not taking: Reported on 09/10/2021)   memantine (NAMENDA) 10 MG tablet Take 1 tablet (10 mg total) by mouth 2 (two) times daily.   No facility-administered encounter medications on file as of 01/15/2023.     Objective:     PHYSICAL EXAMINATION:    VITALS:   Vitals:   01/15/23 1438  BP: (!) 103/52  Pulse: (!) 55  Resp: 18  SpO2:  97%  Weight: 169 lb (76.7 kg)  Height: 5\' 3"  (1.6 m)    GEN:  The patient appears stated age and is in NAD. HEENT:  Normocephalic, atraumatic.   Neurological examination:  General: NAD, well-groomed, appears stated age. Orientation: The patient is alert. Oriented to person, place and date Cranial nerves: There is good facial symmetry.The speech is fluent and clear. No aphasia or dysarthria. Fund of knowledge is appropriate. Recent memory impaired and remote memory is normal.  Attention and concentration are normal.  Able to name objects and repeat phrases.  Hearing is intact to conversational tone.   Delayed recall 0/3. Sensation: Sensation is intact to light touch throughout Motor: Strength is at least antigravity x4. DTR's 2/4 in UE/LE      11/22/2020   12:00 PM  Montreal Cognitive Assessment   Visuospatial/ Executive (0/5) 5  Naming (0/3) 3  Attention: Read list of digits (0/2) 2  Attention: Read list of letters (0/1) 1  Attention: Serial 7 subtraction starting at 100 (0/3) 3  Language: Repeat phrase (0/2) 2  Language : Fluency (0/1) 1  Abstraction (0/2) 2  Delayed Recall (0/5) 2  Orientation (0/6) 5  Total 26  Adjusted Score (based on education) 26       01/15/2023    7:00 PM 06/04/2022    4:00 PM 03/05/2022    5:00 PM  MMSE - Mini Mental State Exam  Orientation to time 0 4 3  Orientation to Place 4 4 5   Registration 3 3 3   Attention/ Calculation 5 5 5   Recall 0 0 3  Language- name 2 objects 2 2 2   Language- repeat 1 1 1   Language- follow 3 step command 3 3 1   Language- read & follow direction 1 1 1   Write a sentence 1 1 1   Copy design 1 1 1   Total score 21 25 26        Movement examination: Tone: There is normal tone in the UE/LE Abnormal movements:  no tremor.  No myoclonus.  No asterixis.   Coordination:  There is no decremation with RAM's. Normal finger to nose  Gait and Station: The patient has no difficulty arising out of a deep-seated chair without the  use of the hands. The patient's stride length is good.  Gait is cautious and narrow.   Thank you for allowing [place] the opportunity to participate in the care of this nice patient. Please do not hesitate to contact us for any questions or concerns.   Total time spent on today's visit was 44 minutes dedicated to this patient today, preparing to see patient, examining the patient, ordering tests and/or medications and counseling the patient, documenting clinical information in the EHR or other health record, independently interpreting results and communicating results to the patient/family, discussing treatment and goals, answering patient's questions and coordinating care.  Cc:  Megan Floro, MD  [name] 01/15/2023 7:23 PM

## 2023-01-16 LAB — VITAMIN B12: Vitamin B-12: 555 pg/mL (ref 200–1100)

## 2023-01-16 NOTE — Progress Notes (Signed)
B12 is normal, thank you very much

## 2023-01-19 ENCOUNTER — Telehealth: Payer: Self-pay | Admitting: Physician Assistant

## 2023-01-19 NOTE — Telephone Encounter (Signed)
I will place patient on cancellation list per [name], PA-C.

## 2023-01-19 NOTE — Telephone Encounter (Signed)
Pt and her husband came into the office upset that they had to reschedule her neuropsych testing all the way out until 12/30/23. The pt's husband want to double check that it is ok for the pt to wait that long to do the testing?

## 2023-01-22 DIAGNOSIS — E7800 Pure hypercholesterolemia, unspecified: Secondary | ICD-10-CM | POA: Diagnosis not present

## 2023-01-22 DIAGNOSIS — Z0000 Encounter for general adult medical examination without abnormal findings: Secondary | ICD-10-CM | POA: Diagnosis not present

## 2023-01-22 DIAGNOSIS — I77810 Thoracic aortic ectasia: Secondary | ICD-10-CM | POA: Diagnosis not present

## 2023-01-22 DIAGNOSIS — N1831 Chronic kidney disease, stage 3a: Secondary | ICD-10-CM | POA: Diagnosis not present

## 2023-01-26 ENCOUNTER — Institutional Professional Consult (permissible substitution): Payer: Medicare Other | Admitting: Psychology

## 2023-01-26 ENCOUNTER — Ambulatory Visit: Payer: Self-pay

## 2023-02-07 ENCOUNTER — Ambulatory Visit
Admission: RE | Admit: 2023-02-07 | Discharge: 2023-02-07 | Disposition: A | Discharge status: Home / Self Care | Payer: Medicare Other | Source: Ambulatory Visit | Attending: Physician Assistant | Admitting: Physician Assistant

## 2023-02-07 DIAGNOSIS — R413 Other amnesia: Secondary | ICD-10-CM | POA: Diagnosis not present

## 2023-02-11 DIAGNOSIS — Z0000 Encounter for general adult medical examination without abnormal findings: Secondary | ICD-10-CM | POA: Diagnosis not present

## 2023-02-11 DIAGNOSIS — Z23 Encounter for immunization: Secondary | ICD-10-CM | POA: Diagnosis not present

## 2023-02-19 DIAGNOSIS — H40013 Open angle with borderline findings, low risk, bilateral: Secondary | ICD-10-CM | POA: Diagnosis not present

## 2023-02-19 DIAGNOSIS — H35032 Hypertensive retinopathy, left eye: Secondary | ICD-10-CM | POA: Diagnosis not present

## 2023-03-05 DIAGNOSIS — Z1211 Encounter for screening for malignant neoplasm of colon: Secondary | ICD-10-CM | POA: Diagnosis not present

## 2023-03-05 DIAGNOSIS — Z1212 Encounter for screening for malignant neoplasm of rectum: Secondary | ICD-10-CM | POA: Diagnosis not present

## 2023-03-29 ENCOUNTER — Ambulatory Visit: Payer: Self-pay

## 2023-03-29 ENCOUNTER — Institutional Professional Consult (permissible substitution): Payer: Medicare Other | Admitting: Psychology

## 2023-04-09 ENCOUNTER — Encounter: Payer: Medicare Other | Admitting: Psychology

## 2023-04-12 DIAGNOSIS — R195 Other fecal abnormalities: Secondary | ICD-10-CM | POA: Diagnosis not present

## 2023-04-12 DIAGNOSIS — R413 Other amnesia: Secondary | ICD-10-CM | POA: Diagnosis not present

## 2023-05-11 DIAGNOSIS — R195 Other fecal abnormalities: Secondary | ICD-10-CM | POA: Diagnosis not present

## 2023-05-11 DIAGNOSIS — D122 Benign neoplasm of ascending colon: Secondary | ICD-10-CM | POA: Diagnosis not present

## 2023-05-11 DIAGNOSIS — K5730 Diverticulosis of large intestine without perforation or abscess without bleeding: Secondary | ICD-10-CM | POA: Diagnosis not present

## 2023-05-13 DIAGNOSIS — D122 Benign neoplasm of ascending colon: Secondary | ICD-10-CM | POA: Diagnosis not present

## 2023-06-30 ENCOUNTER — Ambulatory Visit: Payer: Medicare Other | Admitting: Psychology

## 2023-06-30 DIAGNOSIS — G3184 Mild cognitive impairment, so stated: Secondary | ICD-10-CM

## 2023-06-30 DIAGNOSIS — R413 Other amnesia: Secondary | ICD-10-CM

## 2023-06-30 DIAGNOSIS — R4189 Other symptoms and signs involving cognitive functions and awareness: Secondary | ICD-10-CM
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
## 2023-06-30 NOTE — Progress Notes (Signed)
   Psychometrician Note   Cognitive testing was administered to Megan Schultz by Wallace Keller, B.S. (psychometrist) under the supervision of Dr. Annice Pih, Psy.D., licensed psychologist on 06/30/2023. Megan Schultz did not appear overtly distressed by the testing session per behavioral observation or responses across self-report questionnaires. Rest breaks were offered.    The battery of tests administered was selected by Dr. Annice Pih, Psy.D. with consideration to Megan Schultz's current level of functioning, the nature of her symptoms, emotional and behavioral responses during interview, level of literacy, observed level of motivation/effort, and the nature of the referral question. This battery was communicated to the psychometrist. Communication between Dr. Annice Pih, Psy.D. and the psychometrist was ongoing throughout the evaluation and Dr. Annice Pih, Psy.D. was immediately accessible at all times. Dr. Annice Pih, Psy.D. provided supervision to the psychometrist on the date of this service to the extent necessary to assure the quality of all services provided.    Megan Schultz will return within approximately 1-2 weeks for an interactive feedback session with Dr. Robbie Lis at which time her test performances, clinical impressions, and treatment recommendations will be reviewed in detail. Megan Schultz understands she can contact our office should she require our assistance before this time.  A total of 105 minutes of billable time were spent face-to-face with Megan Schultz by the psychometrist. This includes both test administration and scoring time. Billing for these services is reflected in the clinical report generated by Dr. Annice Pih, Psy.D.  This note reflects time spent with the psychometrician and does not include test scores or any clinical interpretations made by Dr. Robbie Lis. The full report will follow in a separate note.

## 2023-07-01 ENCOUNTER — Encounter: Payer: Self-pay | Admitting: Psychology

## 2023-07-07 ENCOUNTER — Encounter: Payer: Medicare Other | Admitting: Psychology

## 2023-07-15 ENCOUNTER — Ambulatory Visit: Payer: Medicare Other | Admitting: Physician Assistant

## 2023-07-21 ENCOUNTER — Ambulatory Visit: Payer: Medicare Other | Admitting: Neurology

## 2023-07-21 ENCOUNTER — Encounter: Payer: Self-pay | Admitting: Neurology

## 2023-07-21 VITALS — BP 129/64 | HR 69 | Ht 66.0 in | Wt 175.6 lb

## 2023-07-21 DIAGNOSIS — G3184 Mild cognitive impairment, so stated: Secondary | ICD-10-CM

## 2023-07-21 DIAGNOSIS — F419 Anxiety disorder, unspecified: Secondary | ICD-10-CM | POA: Diagnosis not present

## 2023-07-21 MED ORDER — MEMANTINE HCL 10 MG PO TABS: 10.0000 mg | ORAL_TABLET | Freq: Two times a day (BID) | ORAL | 3 refills | Status: AC

## 2023-07-21 MED ORDER — CITALOPRAM HYDROBROMIDE 20 MG PO TABS: 20.0000 mg | ORAL_TABLET | Freq: Every day | ORAL | 3 refills | Status: DC

## 2023-07-21 NOTE — Progress Notes (Signed)
 NEUROLOGY FOLLOW UP OFFICE NOTE  Megan Schultz [id number] 1950/12/06  HISTORY OF PRESENT ILLNESS: I had the pleasure of seeing Megan Schultz in follow-up in the neurology clinic on 07/21/2023.  The patient was last seen by Memory Disorders PA Megan Schultz 6 months ago for memory loss. She is accompanied by her husband and son Megan Schultz who help supplement the history today.  Records and images were personally reviewed where available.  They present today to discuss recent Neuropsychological testing results (06/30/2023) which showed "cognitive impairment in multiple domains and very mild functional decline, placing her on the border between a diagnosis of mild cognitive impairment and mild dementia. Regarding functional status, she remains broadly independent but has started to exhibit some difficulty with medication management over the past 6-8 months (family recently implemented a pillbox to monitor if the patient can continue managing medications independently). As such, a diagnosis of amnestic mild cognitive impairment, multiple domains, is conservatively given in an effort not to prematurely diagnose a more advanced stage. The overall clinical picture, including an insidious onset of symptoms, gradual decline, difficulties with orientation, recent episodes of confusion, and deficits in multiple cognitive domains (e.g., poor learning, rapid forgetting, reduced access to semantic knowledge, difficulties with aspects of visuospatial functioning), raises concern for the incipient stages of an Alzheimer's disease process." I personally reviewed brain MRI without contrast done 02/2023 which did not show any acute changes, there was mild diffuse atrophy and chronic microvascular disease.  She was initially seen by neurologist Dr. Marjory Schultz in 05/2020 for memory loss, they report changes started around 2019. She was then see in our office in 11/2020 and had Neuropsychological testing in 01/2021 which was truncated  because she became upset and tearful after completing a brief portion of the exam. She was started on Memantine in 09/2021, dose increased to 10mg  BID in 06/2022. She denies any headaches, dizziness, vision changes, focal numbness/tingling/weakness, no falls. Sleep is good. She feels her memory is "a bit scattered." Her husband took over medications a few weeks ago, they were noticing she was ending up with more medications at the end of the month, indicating missed doses. She denies getting lost driving, her husband reports that she has gotten lost a few times. Last time was 3 weeks ago, she was coming home from [facility] house and called her husband, she had made a wrong turn and he had to help her get home. He has always managed finances. She repeats herself and misplaces things, hiding her rings. No hygiene concerns, she is independent with dressing, bathing, doing laundry. She does not cook, she uses the microwave without difficulty. She thinks her mood is okay, Megan Schultz thinks it is fine. Her husband notes anxiety can be an issue, she is taking Citalopram 10mg  1/2 tab BID. There were 2 incidents where she became confused recognizing her husband. She told her son that a man in her house was an old boyfriend named Actor. She asked her other son to verify that someone should not be in the house. There is some paranoia that someone will steal her rings, she hides them in different places. They exercise regularly, and Megan Schultz has noticed less of the sundowning/paranoia when they exercise. Her mother had dementia.    Initial visit with Memory Disorders PA Megan Schultz on 11/22/20: The patient is seen in neurologic consultation at the request of Megan Floro, MD for the evaluation of memory.  The patient is accompanied by son Megan Schultz who supplements the history. She  is a 73 y.o. year old female who has had memory issues for about  3 years, Initially seen by Dr. Marjory Schultz at [facility] on 05/14/20, diagnosed with MCI. MMSE  at the  time was 28.  No medications were recommended at the time. Initially,her memory issues were noted by her son during the Summer of 2019 when she repeated the same conversation, not remembering she had talked about it before. She also asked similar questions. She adds"it's not uncommon for me to have 3 things in my mind, so I might forget I asked". She reports that the Covid pandemic has greatly affected her. Her relationship with her husband is not good, and has been worse over the last 2 years, with " possibly an infidelity too"-son says. SHe does not do any activities without him, and other than enjoying her grandchildren and running with the other son daily, 1-2 miles a day, she is frequently bored. She cries often, afraid that she has dementia like her mother.  She has not yet seen a therapist or psychiatrist. Recently, the patient was started on Celexa, which does not seem to help, as the patient is taking it as needed rather than on a regimen.  She states that this makes her very sleepy, because she is taking it in the morning.  She sleeps about 8 hours, without vivid dreams or sleepwalking.  She denies hallucinations or paranoia.  She is independent of bathing and dressing.  Her husband has been always in charge of the finances, "is his thing".  She does not cook a lot, because her husband likes to go [employer] all the time, and her diet includes a lot of bread, and ice cream.  She has not been eating a healthy diet in years.  She does not take a lot of medications, but she is able to take it and not forget the doses.  She denies leaving objects in unusual places.  Her appetite is good, and denies any trouble swallowing.  The patient continues to drive, denies getting lost.  She denies any headaches, falls, injuries to the head except for a skiing accident 30 years ago, with neck pain x 5 years requiring a chiropractor, double vision, dizziness, focal numbness or tingling, unilateral weakness or tremors.   Denies urine incontinence or retention, constipation or diarrhea.  Denies anosmia.  Denies a history of OSA, alcohol or tobacco.  As mentioned before, her family history is remarkable for dementia in her mother, whom she took care of until she died.  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     MRI brain with and without contrast 06/20/20 1. No acute intracranial abnormality. 2. Hyperintense T2-weighted signal within the superior cerebellar peduncles, posterior midbrain and left basal ganglia with mild superior cerebellar atrophy. Findings suggest Wernicke encephalopathy.  MRI brain without contrast 02/2021: mild diffuse atrophy and mild chronic microvascular disease   Neurocognitive testing 01/09/21 Dr. Roseanne Reno  "We discussed impression of mild cognitive impairment, with memory loss based on clinical history, as reflected in the patient instructions. I discussed possible etiologies including AD and wernicke-korsakoff, and we also discussed primary prevention, including MIND diet, exercise (she is already doing some), and the like. We discussed the importance of not catastrophizing, and the difference between Alzheimer's disease, the clinical state of dementia, and mild cognitive impairment."    Labs 05/14/20 Vit B12 05/04/20  653 TSH 2.090 B1 123.1   PAST MEDICAL HISTORY: Past Medical History:  Diagnosis Date   ADD (attention  deficit disorder)    High cholesterol    Major depression in partial remission (HCC)    Memory changes    Muscle spasm     MEDICATIONS: Current Outpatient Medications on File Prior to Visit  Medication Sig Dispense Refill   B-Complex TABS daily in the afternoon.     cetirizine (ZYRTEC) 10 MG tablet Take 10 mg by mouth daily. (Patient not taking: Reported on 09/10/2021)     citalopram (CELEXA) 20 MG tablet Take 20 mg by mouth daily.     citalopram (CELEXA) 20 MG tablet Take 20 mg by mouth daily in the afternoon.     fluticasone (FLONASE) 50 MCG/ACT nasal spray Place 1  spray into both nostrils 2 (two) times daily.     hydrOXYzine (ATARAX) 25 MG tablet Take 25 mg by mouth at bedtime as needed. (Patient not taking: Reported on 09/10/2021)     memantine (NAMENDA) 10 MG tablet Take 1 tablet (10 mg total) by mouth 2 (two) times daily. 180 tablet 3   No current facility-administered medications on file prior to visit.    ALLERGIES: Allergies  Allergen Reactions   Amoxicillin Itching    FAMILY HISTORY: Family History  Problem Relation Age of Onset   Dementia Mother    Cancer Father        gall bladder   Other Maternal Grandmother        syringomyelia   Alcoholism Paternal Grandfather    Breast cancer Neg Hx     SOCIAL HISTORY: Social History   Socioeconomic History   Marital status: Married    Spouse name: Nicholes Mango   Number of children: 3   Years of education: 15 - Diploma RN   Highest education level: Associate degree: academic program  Occupational History    Comment: retired Charity fundraiser   Tobacco Use   Smoking status: Never   Smokeless tobacco: Never  Vaping Use   Vaping status: Never Used  Substance and Sexual Activity   Alcohol use: Not Currently   Drug use: Never   Sexual activity: Not on file  Other Topics Concern   Not on file  Social History Narrative   05/14/20 lives with spouse   Right handed    No caffeine   Two floors home   retired   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 No aphasia or dysarthria. Fund of knowledge is appropriate. Attention and concentration are normal.   Cranial nerves:  Pupils equal, round. Extraocular movements intact with no nystagmus. Visual fields full.  No facial asymmetry.  Motor: Bulk and tone normal, muscle strength 5/5 throughout with no pronator drift.   Finger to nose testing intact.  Gait narrow-based and steady, able to tandem walk adequately.  Romberg negative.   IMPRESSION: This is a pleasant 73 yo RH woman with a history of hyperlipidemia, depression, anxiety, ADHD, family history of dementia, presenting to discuss results of Neuropsychological testing done 3 weeks ago indicating Amnestic Mild Cognitive Impairment, multiple domains, concern raised for the incipient stages of an Alzheimer's  disease process. We discussed the diagnosis, prognosis, and management, including recommendation for CSF AD biomarkers, they agree for lumbar puncture. We discussed new available medications for MCI due to AD, including Lecanemab and Donanemab, side effects, at this point they are not interested. Continue Memantine 10mg  BID. We discussed mood and behavioral changes that can occur, increase Citalopram to 20mg  daily. We also discussed driving concerns, I had recommended a driving evaluation due to report of getting lost, however her husband reports he is with her most of the time and they have a GPS tracker on, continue to monitor closely. We discussed the importance of control of vascular risk factors, physical and brain stimulation exercises, and MIND diet for overall brain health. Follow-up in 6 months, call for any changes.    Thank you for allowing me to participate in her care.  Please do not hesitate to call for any questions or concerns.    Patrcia Dolly, M.D.   CC: Dr. Tenny Craw      Case discussed with Dr. Karel Jarvis who agrees with the plan

## 2023-07-21 NOTE — Patient Instructions (Addendum)
 Good to meet you.  Schedule lumbar puncture  2. Increase Citalopram to 20mg : take 1 tablet every morning  3. Continue Memantine 10mg  twice a day  4. Driving evaluation is recommended, here are some places that offer this service: [facility] in [place]: [phone] [facility]: [phone] [facility]: [phone] [facility]: [phone] or [phone]  5. Follow-up in 6 months, call for any changes   FALL PRECAUTIONS: Be cautious when walking. Scan the area for obstacles that may increase the risk of trips and falls. When getting up in the mornings, sit up at the edge of the bed for a few minutes before getting out of bed. Consider elevating the bed at the head end to avoid drop of blood pressure when getting up. Walk always in a well-lit room (use night lights in the walls). Avoid area rugs or power cords from appliances in the middle of the walkways. Use a walker or a cane if necessary and consider physical therapy for balance exercise. Get your eyesight checked regularly.  FINANCIAL OVERSIGHT: Supervision, especially oversight when making financial decisions or transactions is also recommended.  HOME SAFETY: Consider the safety of the kitchen when operating appliances like stoves, microwave oven, and blender. Consider having supervision and share cooking responsibilities until no longer able to participate in those. Accidents with firearms and other hazards in the house should be identified and addressed as well.  DRIVING: Regarding driving, in patients with progressive memory problems, driving will be impaired. We advise to have someone else do the driving if trouble finding directions or if minor accidents are reported. Independent driving assessment is available to determine safety of driving.  ABILITY TO BE LEFT ALONE: If patient is unable to contact 911 operator, consider using LifeLine, or when the need is there, arrange for  someone to stay with patients. Smoking is a fire hazard, consider supervision or cessation. Risk of wandering should be assessed by caregiver and if detected at any point, supervision and safe proof recommendations should be instituted.  MEDICATION SUPERVISION: Inability to self-administer medication needs to be constantly addressed. Implement a mechanism to ensure safe administration of the medications.  RECOMMENDATIONS FOR ALL PATIENTS WITH MEMORY PROBLEMS: 1. Continue to exercise (Recommend 30 minutes of walking everyday, or 3 hours every week) 2. Increase social interactions - continue going to [place] and enjoy social gatherings with friends and family 3. Eat healthy, avoid fried foods and eat more fruits and vegetables 4. Maintain adequate blood pressure, blood sugar, and blood cholesterol level. Reducing the risk of stroke and cardiovascular disease also helps promoting better memory. 5. Avoid stressful situations. Live a simple life and avoid aggravations. Organize your time and prepare for the next day in anticipation. 6. Sleep well, avoid any interruptions of sleep and avoid any distractions in the bedroom that may interfere with adequate sleep quality 7. Avoid sugar, avoid sweets as there is a strong link between excessive sugar intake, diabetes, and cognitive impairment We discussed the Mediterranean diet, which has been shown to help patients reduce the risk of progressive memory disorders and reduces cardiovascular risk. This includes eating fish, eat fruits and green leafy vegetables, nuts like almonds and hazelnuts, walnuts, and also use olive oil. Avoid fast foods and fried foods as much as possible. Avoid sweets and sugar as sugar use has been linked to worsening of memory function.  There is always a concern of gradual progression of memory problems. If this is the case, then we may need to  adjust level of care according to patient needs. Support, both to the patient and caregiver,  should then be put into place.       Mediterranean Diet  Why follow it? Research shows. Those who follow the Mediterranean diet have a reduced risk of heart disease  The diet is associated with a reduced incidence of Parkinson's and Alzheimer's diseases People following the diet may have longer life expectancies and lower rates of chronic diseases  The Dietary Guidelines for Americans recommends the Mediterranean diet as an eating plan to promote health and prevent disease  What Is the Mediterranean Diet?  Healthy eating plan based on typical foods and recipes of Mediterranean-style cooking The diet is primarily a plant based diet; these foods should make up a majority of meals   Starches - Plant based foods should make up a majority of meals - They are an important sources of vitamins, minerals, energy, antioxidants, and fiber - Choose whole grains, foods high in fiber and minimally processed items  - Typical grain sources include wheat, oats, barley, corn, brown rice, bulgar, farro, millet, polenta, couscous  - Various types of beans include chickpeas, lentils, fava beans, black beans, white beans   Fruits  Veggies - Large quantities of antioxidant rich fruits & veggies; 6 or more servings  - Vegetables can be eaten raw or lightly drizzled with oil and cooked  - Vegetables common to the traditional Mediterranean Diet include: artichokes, arugula, beets, broccoli, brussel sprouts, cabbage, carrots, celery, collard greens, cucumbers, eggplant, kale, leeks, lemons, lettuce, mushrooms, okra, onions, peas, peppers, potatoes, pumpkin, radishes, rutabaga, shallots, spinach, sweet potatoes, turnips, zucchini - Fruits common to the Mediterranean Diet include: apples, apricots, avocados, cherries, clementines, dates, figs, grapefruits, grapes, melons, nectarines, oranges, peaches, pears, pomegranates, strawberries, tangerines  Fats - Replace butter and margarine with healthy oils, such as olive oil,  canola oil, and tahini  - Limit nuts to no more than a handful a day  - Nuts include walnuts, almonds, pecans, pistachios, pine nuts  - Limit or avoid candied, honey roasted or heavily salted nuts - Olives are central to the [employer] - can be eaten whole or used in a variety of dishes   Meats Protein - Limiting red meat: no more than a few times a month - When eating red meat: choose lean cuts and keep the portion to the size of deck of cards - Eggs: approx. 0 to 4 times a week  - Fish and lean poultry: at least 2 a week  - Healthy protein sources include, chicken, [place], lean beef, lamb - Increase intake of seafood such as tuna, salmon, trout, mackerel, shrimp, scallops - Avoid or limit high fat processed meats such as sausage and bacon  Dairy - Include moderate amounts of low fat dairy products  - Focus on healthy dairy such as fat free yogurt, skim milk, low or reduced fat cheese - Limit dairy products higher in fat such as whole or 2% milk, cheese, ice cream  Alcohol - Moderate amounts of red wine is ok  - No more than 5 oz daily for women (all ages) and men older than age 76  - No more than 10 oz of wine daily for men younger than 65  Other - Limit sweets and other desserts  - Use herbs and spices instead of salt to flavor foods  - Herbs and spices common to the traditional Mediterranean Diet include: basil, bay leaves, chives, cloves, cumin, fennel, garlic, lavender, marjoram, mint,  oregano, parsley, pepper, rosemary, sage, savory, sumac, tarragon, thyme   It's not just a diet, it's a lifestyle:  The Mediterranean diet includes lifestyle factors typical of those in the region  Foods, drinks and meals are best eaten with others and savored Daily physical activity is important for overall good health This could be strenuous exercise like running and aerobics This could also be more leisurely activities such as walking, housework, yard-work, or taking the stairs Moderation  is the key; a balanced and healthy diet accommodates most foods and drinks Consider portion sizes and frequency of consumption of certain foods   Meal Ideas & Options:  Breakfast:  Whole wheat toast or whole wheat English muffins with peanut butter & hard boiled egg Steel cut oats topped with apples & cinnamon and skim milk  Fresh fruit: banana, strawberries, melon, berries, peaches  Smoothies: strawberries, bananas, greek yogurt, peanut butter Low fat greek yogurt with blueberries and granola  Egg white omelet with spinach and mushrooms Breakfast couscous: whole wheat couscous, apricots, skim milk, cranberries  Sandwiches:  Hummus and grilled vegetables (peppers, zucchini, squash) on whole wheat bread   Grilled chicken on whole wheat pita with lettuce, tomatoes, cucumbers or tzatziki  [place] salad on whole wheat bread: tuna salad made with greek yogurt, olives, red peppers, capers, green onions Garlic rosemary lamb pita: lamb sauted with garlic, rosemary, salt & pepper; add lettuce, cucumber, greek yogurt to pita - flavor with lemon juice and black pepper  Seafood:  Mediterranean grilled salmon, seasoned with garlic, basil, parsley, lemon juice and black pepper Shrimp, lemon, and spinach whole-grain pasta salad made with low fat greek yogurt  Seared scallops with lemon orzo  Seared tuna steaks seasoned salt, pepper, coriander topped with tomato mixture of olives, tomatoes, olive oil, minced garlic, parsley, green onions and cappers  Meats:  Herbed greek chicken salad with kalamata olives, cucumber, feta  Red bell peppers stuffed with spinach, bulgur, lean ground beef (or lentils) & topped with feta   Kebabs: skewers of chicken, tomatoes, onions, zucchini, squash  [place] burgers: made with red onions, mint, dill, lemon juice, feta cheese topped with roasted red peppers Vegetarian Cucumber salad: cucumbers, artichoke hearts, celery, red onion, feta cheese, tossed in olive oil & lemon juice   Hummus and whole grain pita points with a greek salad (lettuce, tomato, feta, olives, cucumbers, red onion) Lentil soup with celery, carrots made with vegetable broth, garlic, salt and pepper  Tabouli salad: parsley, bulgur, mint, scallions, cucumbers, tomato, radishes, lemon juice, olive oil, salt and pepper.

## 2023-07-22 ENCOUNTER — Other Ambulatory Visit: Payer: Self-pay

## 2023-07-22 DIAGNOSIS — R413 Other amnesia: Secondary | ICD-10-CM

## 2023-07-22 DIAGNOSIS — G3184 Mild cognitive impairment, so stated: Secondary | ICD-10-CM

## 2023-07-22 DIAGNOSIS — R4189 Other symptoms and signs involving cognitive functions and awareness: Secondary | ICD-10-CM

## 2023-09-13 DIAGNOSIS — H40013 Open angle with borderline findings, low risk, bilateral: Secondary | ICD-10-CM | POA: Diagnosis not present

## 2023-09-13 DIAGNOSIS — H43812 Vitreous degeneration, left eye: Secondary | ICD-10-CM | POA: Diagnosis not present

## 2023-09-13 DIAGNOSIS — H524 Presbyopia: Secondary | ICD-10-CM | POA: Diagnosis not present

## 2023-09-13 DIAGNOSIS — H35032 Hypertensive retinopathy, left eye: Secondary | ICD-10-CM | POA: Diagnosis not present

## 2023-09-13 DIAGNOSIS — H25813 Combined forms of age-related cataract, bilateral: Secondary | ICD-10-CM | POA: Diagnosis not present

## 2023-12-30 ENCOUNTER — Ambulatory Visit: Payer: Self-pay

## 2023-12-30 ENCOUNTER — Institutional Professional Consult (permissible substitution): Payer: Medicare Other | Admitting: Psychology

## 2024-01-26 ENCOUNTER — Ambulatory Visit: Admitting: Neurology

## 2024-01-26 ENCOUNTER — Telehealth: Payer: Self-pay | Admitting: Neurology

## 2024-01-26 ENCOUNTER — Encounter: Payer: Self-pay | Admitting: Neurology

## 2024-01-26 VITALS — BP 113/70 | HR 86 | Ht 66.0 in | Wt 172.0 lb

## 2024-01-26 DIAGNOSIS — G3184 Mild cognitive impairment, so stated: Secondary | ICD-10-CM

## 2024-01-26 DIAGNOSIS — F419 Anxiety disorder, unspecified: Secondary | ICD-10-CM

## 2024-01-26 NOTE — Progress Notes (Signed)
 NEUROLOGY FOLLOW UP OFFICE NOTE  Megan Schultz [id number] [birth year]  HISTORY OF PRESENT ILLNESS: I had the pleasure of seeing Megan Schultz in follow-up in the neurology clinic on 01/26/2024.  The patient was last seen 6 months ago for amnestic MCI. She is accompanied by her husband and son Megan Schultz who help supplement the history today. Records and images were personally reviewed where available.  Since her last visit, there has been a steady gradual decline. She continues to have episodes around once a week where she forgets that her husband and Megan Schultz are the same person, having to call Megan Schultz and sometimes he comes over. This would last for 1-2 hours then she becomes less confused and less upset about it. There is generally no anxiety, most of the time she is in a great mood. They would notice changes especially when traveling, there would be chunks of memory that go completely missing, for instance she forgot her other son was married and was surprised by it, she felt it was hidden from her. This also happens at home every 3 weeks or so where she calls Megan Schultz to confirm. She would hide things so they got a safe and she now puts those things inside. She does not drive much, no issues when she does. Her husband manages medications, finances, meals. She thinks she is doing alright, I usually write things down. She is independent with dressing and bathing, she does all the laundry. Repetition of clothes sometimes occurs (clean clothes, she tends to like to wear the some ones). Hygiene is very good. No paranoia or hallucinations. She stays active, walking, playing tennis every 2 weeks, hours of yardwork, and going to the gym 3 times a week with her other son. Sleep is good. She is on Citalopram  1/2 tablet twice a day (husband is unsure of dosage) and Memantine  10mg  BID without side effects.  She reports occasional midline headaches when exercising. In the past she would complain of this for days at a time,  but this has quieted down. She denies any dizziness, focal numbness/tingling/weakness, no falls.    Initial visit with Memory Disorders PA Camie Sevin on 11/22/20: The patient is seen in neurologic consultation at the request of Okey Carlin Redbird, MD for the evaluation of memory.  The patient is accompanied by son Megan Schultz who supplements the history. She is a 73 y.o. year old female who has had memory issues for about  3 years, Initially seen by Dr. Margaret at [facility] on 05/14/20, diagnosed with MCI. MMSE  at the time was 28.  No medications were recommended at the time. Initially,her memory issues were noted by her son during the Summer of 2019 when she repeated the same conversation, not remembering she had talked about it before. She also asked similar questions. She addsit's not uncommon for me to have 3 things in my mind, so I might forget I asked. She reports that the Covid pandemic has greatly affected her. Her relationship with her husband is not good, and has been worse over the last 2 years, with  possibly an infidelity too-son says. SHe does not do any activities without him, and other than enjoying her grandchildren and running with the other son daily, 1-2 miles a day, she is frequently bored. She cries often, afraid that she has dementia like her mother.  She has not yet seen a therapist or psychiatrist. Recently, the patient was started on Celexa , which does not seem to help, as the patient is  taking it as needed rather than on a regimen.  She states that this makes her very sleepy, because she is taking it in the morning.  She sleeps about 8 hours, without vivid dreams or sleepwalking.  She denies hallucinations or paranoia.  She is independent of bathing and dressing.  Her husband has been always in charge of the finances, is his thing.  She does not cook a lot, because her husband likes to go [employer] all the time, and her diet includes a lot of bread, and ice cream.  She has not been eating  a healthy diet in years.  She does not take a lot of medications, but she is able to take it and not forget the doses.  She denies leaving objects in unusual places.  Her appetite is good, and denies any trouble swallowing.  The patient continues to drive, denies getting lost.  She denies any headaches, falls, injuries to the head except for a skiing accident 30 years ago, with neck pain x 5 years requiring a chiropractor, double vision, dizziness, focal numbness or tingling, unilateral weakness or tremors.  Denies urine incontinence or retention, constipation or diarrhea.  Denies anosmia.  Denies a history of OSA, alcohol or tobacco.  As mentioned before, her family history is remarkable for dementia in her mother, whom she took care of until she died.  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     MRI brain with and without contrast 06/20/20 1. No acute intracranial abnormality. 2. Hyperintense T2-weighted signal within the superior cerebellar peduncles, posterior midbrain and left basal ganglia with mild superior cerebellar atrophy. Findings suggest Wernicke encephalopathy.  MRI brain without contrast 02/2021: mild diffuse atrophy and mild chronic microvascular disease Repeat MRI brain without contrast 02/2023 did not show any acute changes, there was mild diffuse atrophy and chronic microvascular disease.   Neurocognitive testing 01/09/21 Dr. Jackquline  We discussed impression of mild cognitive impairment, with memory loss based on clinical history, as reflected in the patient instructions. I discussed possible etiologies including AD and wernicke-korsakoff, and we also discussed primary prevention, including MIND diet, exercise (she is already doing some), and the like. We discussed the importance of not catastrophizing, and the difference between Alzheimer's disease, the clinical state of dementia, and mild cognitive impairment.   Repeat Neuropsychological testing results (06/30/2023) showed cognitive  impairment in multiple domains and very mild functional decline, placing her on the border between a diagnosis of mild cognitive impairment and mild dementia. Regarding functional status, she remains broadly independent but has started to exhibit some difficulty with medication management over the past 6-8 months (family recently implemented a pillbox to monitor if the patient can continue managing medications independently). As such, a diagnosis of amnestic mild cognitive impairment, multiple domains, is conservatively given in an effort not to prematurely diagnose a more advanced stage. The overall clinical picture, including an insidious onset of symptoms, gradual decline, difficulties with orientation, recent episodes of confusion, and deficits in multiple cognitive domains (e.g., poor learning, rapid forgetting, reduced access to semantic knowledge, difficulties with aspects of visuospatial functioning), raises concern for the incipient stages of an Alzheimer's disease process.    Labs 05/14/20 Vit B12 05/04/20  653 TSH 2.090 B1 123.1   PAST MEDICAL HISTORY: Past Medical History:  Diagnosis Date   ADD (attention deficit disorder)    High cholesterol    Major depression in partial remission    Memory changes    Muscle spasm  MEDICATIONS: Current Outpatient Medications on File Prior to Visit  Medication Sig Dispense Refill   B-Complex TABS daily in the afternoon.     cetirizine (ZYRTEC) 10 MG tablet Take 10 mg by mouth daily.     Cholecalciferol (VITAMIN D-3) 25 MCG (1000 UT) CAPS Take by mouth.     citalopram  (CELEXA ) 20 MG tablet Take 1 tablet (20 mg total) by mouth daily in the afternoon. 90 tablet 3   fluticasone (FLONASE) 50 MCG/ACT nasal spray Place 1 spray into both nostrils 2 (two) times daily.     memantine  (NAMENDA ) 10 MG tablet Take 1 tablet (10 mg total) by mouth 2 (two) times daily. 180 tablet 3   Multiple Vitamin (MULTIVITAMIN) tablet Take 1 tablet by mouth daily.     No  current facility-administered medications on file prior to visit.    ALLERGIES: Allergies  Allergen Reactions   Amoxicillin Itching    FAMILY HISTORY: Family History  Problem Relation Age of Onset   Dementia Mother    Cancer Father        gall bladder   Other Maternal Grandmother        syringomyelia   Alcoholism Paternal Grandfather    Breast cancer Neg Hx     SOCIAL HISTORY: Social History   Socioeconomic History   Marital status: Married    Spouse name: Danetta   Number of children: 3   Years of education: 15 - Diploma RN   Highest education level: Associate degree: academic program  Occupational History    Comment: retired Charity fundraiser   Tobacco Use   Smoking status: Never   Smokeless tobacco: Never  Vaping Use   Vaping status: Never Used  Substance and Sexual Activity   Alcohol use: Not Currently   Drug use: Never   Sexual activity: Not on file  Other Topics Concern   Not on file  Social History Narrative   05/14/20 lives with spouse   Right handed    No caffeine   Two floors home   retired   Chief Executive Officer [employer] investment banker Strain: Not on Ship broker Insecurity: Not on file  Transportation Needs: Not on file  Physical Activity: Not on file  Stress: Not on file  Social Connections: Not on file  Intimate Partner Violence: Not on file     PHYSICAL EXAM: Vitals:   01/26/24 0845  BP: 113/70  Pulse: 86  SpO2: 95%   General: No acute distress Head:  Normocephalic/atraumatic Skin/Extremities: No rash, no edema Neurological Exam: alert and awake. No aphasia or dysarthria. Fund of knowledge is reduced, states she has 5 children, looks to husband (she has 3 children). Recent and remote memory are impaired, 0/3 delayed recall.  Attention and concentration are reduced, 0/5 WORLD backwards. Cranial nerves: Pupils equal, round. Extraocular movements intact with no nystagmus. Visual fields full.  No facial asymmetry.  Motor: Bulk and tone normal, muscle  strength 5/5 throughout with no pronator drift.   Finger to nose testing intact.  Gait narrow-based and steady, able to tandem walk adequately.  Romberg negative.   IMPRESSION: This is a pleasant 73 yo RH woman with a history of hyperlipidemia, depression, anxiety, ADHD, family history of dementia, with mild dementia likely due to Alzheimer's disease. She is on Memantine  10mg  BID without side effects. She is doing well staying active, there is a steady gradual decline in memory. More concerning has been the inability to recognize her spouse and increased confusion mostly during  traveling. We discussed increasing Citalopram  dose, her husband will update on her current dose. We had discussed results of Neurocognitive testing done in 09/2023 and recommendation for CSF AD biomarkers. They would like to proceed and potentially explore options for Lecanemab or Donanemab. Continue to monitor driving. Follow-up after LP, call for any changes.   Thank you for allowing me to participate in her care.  Please do not hesitate to call for any questions or concerns.    Darice Shivers, M.D.   CC: Dr. Okey

## 2024-01-26 NOTE — Telephone Encounter (Signed)
 fyi

## 2024-01-26 NOTE — Patient Instructions (Signed)
 Good to see you!  Schedule spinal tap   2. Continue Memantine  10mg  twice a day  3. Please let me know the dose of Citalopram  she is taking at home and we will adjust based on that  4. Continue to monitor driving  5. Follow-up after spinal tap, call for any changes   FALL PRECAUTIONS: Be cautious when walking. Scan the area for obstacles that may increase the risk of trips and falls. When getting up in the mornings, sit up at the edge of the bed for a few minutes before getting out of bed. Consider elevating the bed at the head end to avoid drop of blood pressure when getting up. Walk always in a well-lit room (use night lights in the walls). Avoid area rugs or power cords from appliances in the middle of the walkways. Use a walker or a cane if necessary and consider physical therapy for balance exercise. Get your eyesight checked regularly.  FINANCIAL OVERSIGHT: Supervision, especially oversight when making financial decisions or transactions is also recommended.  HOME SAFETY: Consider the safety of the kitchen when operating appliances like stoves, microwave oven, and blender. Consider having supervision and share cooking responsibilities until no longer able to participate in those. Accidents with firearms and other hazards in the house should be identified and addressed as well.  DRIVING: Regarding driving, in patients with progressive memory problems, driving will be impaired. We advise to have someone else do the driving if trouble finding directions or if minor accidents are reported. Independent driving assessment is available to determine safety of driving.  ABILITY TO BE LEFT ALONE: If patient is unable to contact 911 operator, consider using LifeLine, or when the need is there, arrange for someone to stay with patients. Smoking is a fire hazard, consider supervision or cessation. Risk of wandering should be assessed by caregiver and if detected at any point, supervision and safe proof  recommendations should be instituted.  MEDICATION SUPERVISION: Inability to self-administer medication needs to be constantly addressed. Implement a mechanism to ensure safe administration of the medications.  RECOMMENDATIONS FOR ALL PATIENTS WITH MEMORY PROBLEMS: 1. Continue to exercise (Recommend 30 minutes of walking everyday, or 3 hours every week) 2. Increase social interactions - continue going to [place] and enjoy social gatherings with friends and family 3. Eat healthy, avoid fried foods and eat more fruits and vegetables 4. Maintain adequate blood pressure, blood sugar, and blood cholesterol level. Reducing the risk of stroke and cardiovascular disease also helps promoting better memory. 5. Avoid stressful situations. Live a simple life and avoid aggravations. Organize your time and prepare for the next day in anticipation. 6. Sleep well, avoid any interruptions of sleep and avoid any distractions in the bedroom that may interfere with adequate sleep quality 7. Avoid sugar, avoid sweets as there is a strong link between excessive sugar intake, diabetes, and cognitive impairment We discussed the Mediterranean diet, which has been shown to help patients reduce the risk of progressive memory disorders and reduces cardiovascular risk. This includes eating fish, eat fruits and green leafy vegetables, nuts like almonds and hazelnuts, walnuts, and also use olive oil. Avoid fast foods and fried foods as much as possible. Avoid sweets and sugar as sugar use has been linked to worsening of memory function.  There is always a concern of gradual progression of memory problems. If this is the case, then we may need to adjust level of care according to patient needs. Support, both to the patient and caregiver, should  then be put into place.       Mediterranean Diet  Why follow it? Research shows. Those who follow the Mediterranean diet have a reduced risk of heart disease  The diet is associated with  a reduced incidence of Parkinson's and Alzheimer's diseases People following the diet may have longer life expectancies and lower rates of chronic diseases  The Dietary Guidelines for Americans recommends the Mediterranean diet as an eating plan to promote health and prevent disease  What Is the Mediterranean Diet?  Healthy eating plan based on typical foods and recipes of Mediterranean-style cooking The diet is primarily a plant based diet; these foods should make up a majority of meals   Starches - Plant based foods should make up a majority of meals - They are an important sources of vitamins, minerals, energy, antioxidants, and fiber - Choose whole grains, foods high in fiber and minimally processed items  - Typical grain sources include wheat, oats, barley, corn, brown rice, bulgar, farro, millet, polenta, couscous  - Various types of beans include chickpeas, lentils, fava beans, black beans, white beans   Fruits  Veggies - Large quantities of antioxidant rich fruits & veggies; 6 or more servings  - Vegetables can be eaten raw or lightly drizzled with oil and cooked  - Vegetables common to the traditional Mediterranean Diet include: artichokes, arugula, beets, broccoli, brussel sprouts, cabbage, carrots, celery, collard greens, cucumbers, eggplant, kale, leeks, lemons, lettuce, mushrooms, okra, onions, peas, peppers, potatoes, pumpkin, radishes, rutabaga, shallots, spinach, sweet potatoes, turnips, zucchini - Fruits common to the Mediterranean Diet include: apples, apricots, avocados, cherries, clementines, dates, figs, grapefruits, grapes, melons, nectarines, oranges, peaches, pears, pomegranates, strawberries, tangerines  Fats - Replace butter and margarine with healthy oils, such as olive oil, canola oil, and tahini  - Limit nuts to no more than a handful a day  - Nuts include walnuts, almonds, pecans, pistachios, pine nuts  - Limit or avoid candied, honey roasted or heavily salted  nuts - Olives are central to the [employer] - can be eaten whole or used in a variety of dishes   Meats Protein - Limiting red meat: no more than a few times a month - When eating red meat: choose lean cuts and keep the portion to the size of deck of cards - Eggs: approx. 0 to 4 times a week  - Fish and lean poultry: at least 2 a week  - Healthy protein sources include, chicken, [place], lean beef, lamb - Increase intake of seafood such as tuna, salmon, trout, mackerel, shrimp, scallops - Avoid or limit high fat processed meats such as sausage and bacon  Dairy - Include moderate amounts of low fat dairy products  - Focus on healthy dairy such as fat free yogurt, skim milk, low or reduced fat cheese - Limit dairy products higher in fat such as whole or 2% milk, cheese, ice cream  Alcohol - Moderate amounts of red wine is ok  - No more than 5 oz daily for women (all ages) and men older than age 78  - No more than 10 oz of wine daily for men younger than 50  Other - Limit sweets and other desserts  - Use herbs and spices instead of salt to flavor foods  - Herbs and spices common to the traditional Mediterranean Diet include: basil, bay leaves, chives, cloves, cumin, fennel, garlic, lavender, marjoram, mint, oregano, parsley, pepper, rosemary, sage, savory, sumac, tarragon, thyme   It's not just a diet,  it's a lifestyle:  The Mediterranean diet includes lifestyle factors typical of those in the region  Foods, drinks and meals are best eaten with others and savored Daily physical activity is important for overall good health This could be strenuous exercise like running and aerobics This could also be more leisurely activities such as walking, housework, yard-work, or taking the stairs Moderation is the key; a balanced and healthy diet accommodates most foods and drinks Consider portion sizes and frequency of consumption of certain foods   Meal Ideas & Options:  Breakfast:  Whole  wheat toast or whole wheat English muffins with peanut butter & hard boiled egg Steel cut oats topped with apples & cinnamon and skim milk  Fresh fruit: banana, strawberries, melon, berries, peaches  Smoothies: strawberries, bananas, greek yogurt, peanut butter Low fat greek yogurt with blueberries and granola  Egg white omelet with spinach and mushrooms Breakfast couscous: whole wheat couscous, apricots, skim milk, cranberries  Sandwiches:  Hummus and grilled vegetables (peppers, zucchini, squash) on whole wheat bread   Grilled chicken on whole wheat pita with lettuce, tomatoes, cucumbers or tzatziki  [place] salad on whole wheat bread: tuna salad made with greek yogurt, olives, red peppers, capers, green onions Garlic rosemary lamb pita: lamb sauted with garlic, rosemary, salt & pepper; add lettuce, cucumber, greek yogurt to pita - flavor with lemon juice and black pepper  Seafood:  Mediterranean grilled salmon, seasoned with garlic, basil, parsley, lemon juice and black pepper Shrimp, lemon, and spinach whole-grain pasta salad made with low fat greek yogurt  Seared scallops with lemon orzo  Seared tuna steaks seasoned salt, pepper, coriander topped with tomato mixture of olives, tomatoes, olive oil, minced garlic, parsley, green onions and cappers  Meats:  Herbed greek chicken salad with kalamata olives, cucumber, feta  Red bell peppers stuffed with spinach, bulgur, lean ground beef (or lentils) & topped with feta   Kebabs: skewers of chicken, tomatoes, onions, zucchini, squash  [place] burgers: made with red onions, mint, dill, lemon juice, feta cheese topped with roasted red peppers Vegetarian Cucumber salad: cucumbers, artichoke hearts, celery, red onion, feta cheese, tossed in olive oil & lemon juice  Hummus and whole grain pita points with a greek salad (lettuce, tomato, feta, olives, cucumbers, red onion) Lentil soup with celery, carrots made with vegetable broth, garlic, salt and  pepper  Tabouli salad: parsley, bulgur, mint, scallions, cucumbers, tomato, radishes, lemon juice, olive oil, salt and pepper.

## 2024-01-26 NOTE — Telephone Encounter (Signed)
 Pt husband called back and states that the patient is taking the 20 mg of the citalopram . Please let  Dr Georjean know

## 2024-02-02 ENCOUNTER — Encounter: Payer: Self-pay | Admitting: Neurology

## 2024-02-02 MED ORDER — CITALOPRAM HYDROBROMIDE 20 MG PO TABS: ORAL_TABLET | ORAL | 3 refills | Status: AC

## 2024-02-02 NOTE — Telephone Encounter (Signed)
 She is taking 20mg : 1/2 tablet twice a day. Please have him increase to 1/2 tablet in AM, 1 tablet in PM. I will send in updated Rx.   Also, pls order lumbar puncture under fluoro, I will send CSF studies in staff message. Thanks

## 2024-02-03 ENCOUNTER — Other Ambulatory Visit: Payer: Self-pay | Admitting: *Deleted

## 2024-02-03 DIAGNOSIS — Z1231 Encounter for screening mammogram for malignant neoplasm of breast: Secondary | ICD-10-CM | POA: Diagnosis not present

## 2024-02-03 DIAGNOSIS — N1831 Chronic kidney disease, stage 3a: Secondary | ICD-10-CM | POA: Diagnosis not present

## 2024-02-03 DIAGNOSIS — E7800 Pure hypercholesterolemia, unspecified: Secondary | ICD-10-CM | POA: Diagnosis not present

## 2024-02-03 DIAGNOSIS — R413 Other amnesia: Secondary | ICD-10-CM

## 2024-02-03 DIAGNOSIS — Z0000 Encounter for general adult medical examination without abnormal findings: Secondary | ICD-10-CM | POA: Diagnosis not present

## 2024-02-03 DIAGNOSIS — G3184 Mild cognitive impairment, so stated: Secondary | ICD-10-CM | POA: Diagnosis not present

## 2024-02-03 NOTE — Telephone Encounter (Signed)
 LVM to pt husband to call the office back.

## 2024-02-03 NOTE — Telephone Encounter (Signed)
 Placed an order for order lumbar puncture under fluoro, I will send CSF studies, filled the paper for [facility] clinic given to [facility].

## 2024-02-04 NOTE — Telephone Encounter (Signed)
 Notified pt husband regarding below message and voiced understanding.

## 2024-02-24 DIAGNOSIS — Z0000 Encounter for general adult medical examination without abnormal findings: Secondary | ICD-10-CM | POA: Diagnosis not present

## 2024-02-28 ENCOUNTER — Institutional Professional Consult (permissible substitution): Payer: Medicare Other | Admitting: Psychology

## 2024-02-28 ENCOUNTER — Ambulatory Visit: Payer: Self-pay

## 2024-03-06 ENCOUNTER — Encounter: Payer: Medicare Other | Admitting: Psychology

## 2024-06-04 ENCOUNTER — Emergency Department (HOSPITAL_COMMUNITY)

## 2024-06-04 ENCOUNTER — Encounter (HOSPITAL_COMMUNITY): Payer: Self-pay

## 2024-06-04 ENCOUNTER — Emergency Department (HOSPITAL_COMMUNITY): Admitting: Certified Registered"

## 2024-06-04 ENCOUNTER — Encounter (HOSPITAL_COMMUNITY)
Admission: EM | Disposition: A | Discharge status: Home / Self Care | Payer: Self-pay | Source: Home / Self Care | Attending: Emergency Medicine

## 2024-06-04 ENCOUNTER — Other Ambulatory Visit: Payer: Self-pay

## 2024-06-04 ENCOUNTER — Ambulatory Visit (HOSPITAL_COMMUNITY)
Admission: EM | Admit: 2024-06-04 | Discharge: 2024-06-04 | Disposition: A | Discharge status: Home / Self Care | Attending: Emergency Medicine | Admitting: Emergency Medicine

## 2024-06-04 DIAGNOSIS — W19XXXA Unspecified fall, initial encounter: Secondary | ICD-10-CM

## 2024-06-04 DIAGNOSIS — S52571A Other intraarticular fracture of lower end of right radius, initial encounter for closed fracture: Secondary | ICD-10-CM

## 2024-06-04 DIAGNOSIS — F32A Depression, unspecified: Secondary | ICD-10-CM

## 2024-06-04 DIAGNOSIS — S0990XA Unspecified injury of head, initial encounter: Secondary | ICD-10-CM

## 2024-06-04 DIAGNOSIS — S0083XA Contusion of other part of head, initial encounter: Secondary | ICD-10-CM

## 2024-06-04 DIAGNOSIS — S01112A Laceration without foreign body of left eyelid and periocular area, initial encounter: Secondary | ICD-10-CM

## 2024-06-04 HISTORY — DX: Unspecified dementia, unspecified severity, without behavioral disturbance, psychotic disturbance, mood disturbance, and anxiety: F03.90

## 2024-06-04 HISTORY — DX: Bradycardia, unspecified: R00.1

## 2024-06-04 LAB — CBC WITH DIFFERENTIAL/PLATELET
Abs Immature Granulocytes: 0.08 10*3/uL — ABNORMAL HIGH (ref 0.00–0.07)
Basophils Absolute: 0 10*3/uL (ref 0.0–0.1)
Basophils Relative: 0 %
Eosinophils Absolute: 0.1 10*3/uL (ref 0.0–0.5)
Eosinophils Relative: 1 %
HCT: 41.3 % (ref 36.0–46.0)
Hemoglobin: 14.6 g/dL (ref 12.0–15.0)
Immature Granulocytes: 1 %
Lymphocytes Relative: 15 %
Lymphs Abs: 1.8 10*3/uL (ref 0.7–4.0)
MCH: 32.1 pg (ref 26.0–34.0)
MCHC: 35.4 g/dL (ref 30.0–36.0)
MCV: 90.8 fL (ref 80.0–100.0)
Monocytes Absolute: 0.7 10*3/uL (ref 0.1–1.0)
Monocytes Relative: 6 %
Neutro Abs: 9.5 10*3/uL — ABNORMAL HIGH (ref 1.7–7.7)
Neutrophils Relative %: 77 %
Platelets: 212 10*3/uL (ref 150–400)
RBC: 4.55 MIL/uL (ref 3.87–5.11)
RDW: 12.5 % (ref 11.5–15.5)
WBC: 12.2 10*3/uL — ABNORMAL HIGH (ref 4.0–10.5)
nRBC: 0 % (ref 0.0–0.2)

## 2024-06-04 LAB — COMPREHENSIVE METABOLIC PANEL WITH GFR
ALT: 25 U/L (ref 0–44)
AST: 41 U/L (ref 15–41)
Albumin: 3.9 g/dL (ref 3.5–5.0)
Alkaline Phosphatase: 82 U/L (ref 38–126)
Anion gap: 11 (ref 5–15)
BUN: 19 mg/dL (ref 8–23)
CO2: 24 mmol/L (ref 22–32)
Calcium: 9.4 mg/dL (ref 8.9–10.3)
Chloride: 103 mmol/L (ref 98–111)
Creatinine, Ser: 1.07 mg/dL — ABNORMAL HIGH (ref 0.44–1.00)
GFR, Estimated: 55 mL/min — ABNORMAL LOW
Glucose, Bld: 101 mg/dL — ABNORMAL HIGH (ref 70–99)
Potassium: 4.6 mmol/L (ref 3.5–5.1)
Sodium: 138 mmol/L (ref 135–145)
Total Bilirubin: 0.3 mg/dL (ref 0.0–1.2)
Total Protein: 6.7 g/dL (ref 6.5–8.1)

## 2024-06-04 LAB — I-STAT CHEM 8, ED
BUN: 21 mg/dL (ref 8–23)
Calcium, Ion: 1.02 mmol/L — ABNORMAL LOW (ref 1.15–1.40)
Chloride: 103 mmol/L (ref 98–111)
Creatinine, Ser: 1.1 mg/dL — ABNORMAL HIGH (ref 0.44–1.00)
Glucose, Bld: 101 mg/dL — ABNORMAL HIGH (ref 70–99)
HCT: 41 % (ref 36.0–46.0)
Hemoglobin: 13.9 g/dL (ref 12.0–15.0)
Potassium: 4.2 mmol/L (ref 3.5–5.1)
Sodium: 137 mmol/L (ref 135–145)
TCO2: 25 mmol/L (ref 22–32)

## 2024-06-04 MED ORDER — ONDANSETRON HCL 4 MG/2ML IJ SOLN: 4.0000 mg | Freq: Once | INTRAMUSCULAR | Status: DC | PRN

## 2024-06-04 MED ORDER — FENTANYL CITRATE (PF) 100 MCG/2ML IJ SOLN: 25.0000 ug | INTRAMUSCULAR | Status: DC | PRN

## 2024-06-04 MED ORDER — CEFADROXIL 500 MG PO CAPS: 500.0000 mg | ORAL_CAPSULE | Freq: Two times a day (BID) | ORAL | 0 refills | Status: AC

## 2024-06-04 MED ORDER — 0.9 % SODIUM CHLORIDE (POUR BTL) OPTIME
TOPICAL | Status: DC | PRN
  Administered 2024-06-04: 2000 mL

## 2024-06-04 MED ORDER — CEFAZOLIN SODIUM-DEXTROSE 2-3 GM-%(50ML) IV SOLR
INTRAVENOUS | Status: DC | PRN
  Administered 2024-06-04: 5 g via INTRAVENOUS
  Administered 2024-06-04: 2 g via INTRAVENOUS

## 2024-06-04 MED ORDER — KETOROLAC TROMETHAMINE 30 MG/ML IJ SOLN
INTRAMUSCULAR | Status: AC
  Filled 2024-06-04: qty 1

## 2024-06-04 MED ORDER — DEXAMETHASONE SOD PHOSPHATE PF 10 MG/ML IJ SOLN
INTRAMUSCULAR | Status: DC | PRN
  Administered 2024-06-04: 4 mg via INTRAVENOUS

## 2024-06-04 MED ORDER — CHLORHEXIDINE GLUCONATE 0.12 % MT SOLN
OROMUCOSAL | Status: AC
  Filled 2024-06-04: qty 15

## 2024-06-04 MED ORDER — EPHEDRINE 5 MG/ML INJ
INTRAVENOUS | Status: AC
  Filled 2024-06-04: qty 5

## 2024-06-04 MED ORDER — FENTANYL CITRATE (PF) 250 MCG/5ML IJ SOLN
INTRAMUSCULAR | Status: DC | PRN
  Administered 2024-06-04 (×3): 25 ug via INTRAVENOUS

## 2024-06-04 MED ORDER — KETOROLAC TROMETHAMINE 15 MG/ML IJ SOLN
INTRAMUSCULAR | Status: DC | PRN
  Administered 2024-06-04: 15 mg via INTRAVENOUS

## 2024-06-04 MED ORDER — LORAZEPAM 2 MG/ML IJ SOLN
0.5000 mg | Freq: Once | INTRAMUSCULAR | Status: AC
  Administered 2024-06-04: 0.5 mg via INTRAVENOUS
  Filled 2024-06-04: qty 1

## 2024-06-04 MED ORDER — PHENYLEPHRINE HCL-NACL 20-0.9 MG/250ML-% IV SOLN: INTRAVENOUS | Status: DC | PRN

## 2024-06-04 MED ORDER — ACETAMINOPHEN 10 MG/ML IV SOLN
INTRAVENOUS | Status: DC | PRN
  Administered 2024-06-04: 1000 mg via INTRAVENOUS

## 2024-06-04 MED ORDER — LIDOCAINE-EPINEPHRINE (PF) 2 %-1:200000 IJ SOLN
20.0000 mL | Freq: Once | INTRAMUSCULAR | Status: AC
  Administered 2024-06-04: 20 mL
  Filled 2024-06-04: qty 20

## 2024-06-04 MED ORDER — CHLORHEXIDINE GLUCONATE 0.12 % MT SOLN
15.0000 mL | Freq: Once | OROMUCOSAL | Status: AC
  Administered 2024-06-04: 15 mL via OROMUCOSAL

## 2024-06-04 MED ORDER — PHENYLEPHRINE 80 MCG/ML (10ML) SYRINGE FOR IV PUSH (FOR BLOOD PRESSURE SUPPORT)
PREFILLED_SYRINGE | INTRAVENOUS | Status: DC | PRN
  Administered 2024-06-04: 80 ug via INTRAVENOUS

## 2024-06-04 MED ORDER — ACETAMINOPHEN 10 MG/ML IV SOLN
INTRAVENOUS | Status: AC
  Filled 2024-06-04: qty 100

## 2024-06-04 MED ORDER — LIDOCAINE 2% (20 MG/ML) 5 ML SYRINGE
INTRAMUSCULAR | Status: DC | PRN
  Administered 2024-06-04: 60 mg via INTRAVENOUS

## 2024-06-04 MED ORDER — PROPOFOL 10 MG/ML IV BOLUS
INTRAVENOUS | Status: DC | PRN
  Administered 2024-06-04: 120 mg via INTRAVENOUS

## 2024-06-04 MED ORDER — ORAL CARE MOUTH RINSE: 15.0000 mL | Freq: Once | OROMUCOSAL | Status: AC

## 2024-06-04 MED ORDER — LACTATED RINGERS IV SOLN: INTRAVENOUS | Status: DC

## 2024-06-04 MED ORDER — IOHEXOL 350 MG/ML SOLN
75.0000 mL | Freq: Once | INTRAVENOUS | Status: AC | PRN
  Administered 2024-06-04: 75 mL via INTRAVENOUS

## 2024-06-04 MED ORDER — OXYCODONE HCL 5 MG PO TABS: 2.5000 mg | ORAL_TABLET | Freq: Four times a day (QID) | ORAL | 0 refills | Status: AC | PRN

## 2024-06-04 MED ORDER — ONDANSETRON HCL 4 MG/2ML IJ SOLN
INTRAMUSCULAR | Status: DC | PRN
  Administered 2024-06-04: 4 mg via INTRAVENOUS

## 2024-06-04 MED ORDER — FENTANYL CITRATE (PF) 100 MCG/2ML IJ SOLN
INTRAMUSCULAR | Status: AC
  Filled 2024-06-04: qty 2

## 2024-06-04 MED ORDER — ONDANSETRON 4 MG PO TBDP: 4.0000 mg | ORAL_TABLET | Freq: Three times a day (TID) | ORAL | 0 refills | Status: AC | PRN

## 2024-06-04 MED ORDER — EPHEDRINE 5 MG/ML INJ
INTRAVENOUS | Status: AC
  Filled 2024-06-04: qty 10

## 2024-06-04 MED ORDER — EPHEDRINE SULFATE-NACL 50-0.9 MG/10ML-% IV SOSY
PREFILLED_SYRINGE | INTRAVENOUS | Status: DC | PRN
  Administered 2024-06-04 (×4): 5 mg via INTRAVENOUS
  Administered 2024-06-04: 2.5 mg via INTRAVENOUS
  Administered 2024-06-04 (×2): 5 mg via INTRAVENOUS
  Administered 2024-06-04: 7.5 mg via INTRAVENOUS

## 2024-06-04 MED ORDER — ACETAMINOPHEN 325 MG PO TABS: 650.0000 mg | ORAL_TABLET | Freq: Four times a day (QID) | ORAL | 2 refills | Status: AC | PRN

## 2024-06-04 NOTE — ED Notes (Signed)
Pt ambulatory to bathroom with son.

## 2024-06-04 NOTE — ED Triage Notes (Signed)
 BIB EMS/ from home/ fall from approx 8-10 carpeted steps/ lac to left forehead/ lac to right elbow,left FA/ deformity to R elbow and forearm/ pt not c/o pain/ no thinners/ hx of dementia/ A&Ox3  140/80 146-CBG 56HR 16RR 95%RA

## 2024-06-04 NOTE — Progress Notes (Signed)
 Orthopedic Tech Progress Note Patient Details:  Megan Schultz 1951-02-13 [id number]  Well-padded sugartong splint and sling placed to the RUE. Thersia Salt, PA-C confirmed she would like a sugar tong vs the volar splint listed in the order. Motion and sensation of the digits remain intact.   Ortho Devices Type of Ortho Device: Sugartong splint, Arm sling Ortho Device/Splint Location: RUE Ortho Device/Splint Interventions: Ordered, Application, Adjustment   Post Interventions Patient Tolerated: Fair Instructions Provided: Care of device, Adjustment of device  [name] 06/04/2024, 9:52 AM

## 2024-06-04 NOTE — Transfer of Care (Signed)
 Immediate Anesthesia Transfer of Care Note  Patient: Megan Schultz  Procedure(s) Performed: OPEN REDUCTION INTERNAL FIXATION (ORIF) DISTAL RADIUS FRACTURE (Right: Wrist)  Patient Location: PACU  Anesthesia Type:General  Level of Consciousness: drowsy, patient cooperative, and responds to stimulation  Airway & Oxygen Therapy: Patient Spontanous Breathing  Post-op Assessment: Report given to RN and Post -op Vital signs reviewed and stable  Post vital signs: Reviewed and stable  Last Vitals:  Vitals Value Taken Time  BP 142/72 06/04/24 14:24  Temp 36.2 C 06/04/24 14:24  Pulse 101 06/04/24 14:28  Resp 17 06/04/24 14:28  SpO2 95 % 06/04/24 14:28  Vitals shown include unfiled device data.  Last Pain:  Vitals:   06/04/24 1138  TempSrc: Oral  PainSc: 0-No pain         Complications: No notable events documented.

## 2024-06-04 NOTE — Anesthesia Postprocedure Evaluation (Signed)
"   Anesthesia Post Note  Patient: Megan Schultz  Procedure(s) Performed: OPEN REDUCTION INTERNAL FIXATION (ORIF) DISTAL RADIUS FRACTURE (Right: Wrist)     Patient location during evaluation: PACU Anesthesia Type: General Level of consciousness: awake and alert Pain management: pain level controlled Vital Signs Assessment: post-procedure vital signs reviewed and stable Respiratory status: spontaneous breathing, nonlabored ventilation and respiratory function stable Cardiovascular status: blood pressure returned to baseline and stable Postop Assessment: no apparent nausea or vomiting Anesthetic complications: no   No notable events documented.  Last Vitals:  Vitals:   06/04/24 1445 06/04/24 1455  BP: (!) 141/75 (!) 141/69  Pulse: 73 90  Resp: 15 14  Temp:    SpO2: 92% 92%    Last Pain:  Vitals:   06/04/24 1440  TempSrc:   PainSc: 0-No pain                 Garnette FORBES Skillern      "

## 2024-06-04 NOTE — ED Provider Notes (Signed)
 " Megan Schultz EMERGENCY DEPARTMENT AT [facility] Provider Note   CSN: [id number] Arrival date & time: 06/04/24  9373     Patient presents with: [place]   Megan Schultz is a 74 y.o. female.  Patient is a 74 year old female with a history of dementia, high cholesterol who presents to the ED via EMS for a fall that occurred this morning.  EMS says the patient fell down approximately 8-10 carpeted stairs.  Husband states that he heard patient fall and the fall was not witnessed.  They believe she may have hit her head on a door stop but they are unsure.  Patient does not have any complaints and does not remember exactly what happened.  At baseline per EMS and husband.  She does have a laceration to the left side of the head and obvious deformity to the right arm.  Patient is not complaining any pain.  She is not on blood thinners.  No further complaints.    Fall Pertinent negatives include no chest pain, no headaches and no shortness of breath.       Prior to Admission medications  Medication Sig Start Date End Date Taking? Authorizing Provider  B-Complex TABS daily in the afternoon. 01/07/21   [provider]  cetirizine (ZYRTEC) 10 MG tablet Take 10 mg by mouth daily. 08/18/21   [provider]  Cholecalciferol (VITAMIN D-3) 25 MCG (1000 UT) CAPS Take by mouth.    [provider]  citalopram  (CELEXA ) 20 MG tablet Take 1/2 tablet in AM, 1 tablet in PM 02/02/24   Georjean Darice HERO, MD  CREATINE PO Take by mouth.    [provider]  fluticasone (FLONASE) 50 MCG/ACT nasal spray Place 1 spray into both nostrils 2 (two) times daily. 08/18/21   [provider]  memantine  (NAMENDA ) 10 MG tablet Take 1 tablet (10 mg total) by mouth 2 (two) times daily. 07/21/23   Georjean Darice HERO, MD  Multiple Vitamin (MULTIVITAMIN) tablet Take 1 tablet by mouth daily.    [provider]    Allergies: Amoxicillin    Review of Systems  Respiratory:   Negative for shortness of breath.   Cardiovascular:  Negative for chest pain.  Gastrointestinal:  Negative for nausea and vomiting.  Skin:  Positive for wound.  Neurological:  Negative for dizziness and headaches.  All other systems reviewed and are negative.   Updated Vital Signs BP 127/73 (BP Location: Left Arm)   Pulse 61   Temp 97.7 F (36.5 C) (Oral)   Resp 17   Wt 79 kg   SpO2 100%   BMI 28.11 kg/m   Physical Exam Constitutional:      Appearance: Normal appearance.  HENT:     Head: Normocephalic.     Comments: V-shaped 5 cm laceration present to the left forehead.  Minimal active bleeding.  Significant hematoma noted to the left forehead and left cheek.    Mouth/Throat:     Mouth: Mucous membranes are moist.     Pharynx: Oropharynx is clear.  Eyes:     Extraocular Movements: Extraocular movements intact.     Pupils: Pupils are equal, round, and reactive to light.     Comments: No pain with EOM  Cardiovascular:     Rate and Rhythm: Normal rate.  Pulmonary:     Effort: Pulmonary effort is normal.     Breath sounds: Normal breath sounds.  Musculoskeletal:     Comments: Limited range of motion of the  right elbow and forearm, obvious deformity noted at the right distal forearm.  Skin:    General: Skin is warm.     Comments: Skin tear noted on the right elbow, right forearm, and left forearm.  Minimal active bleeding.  Neurological:     Mental Status: She is alert.     Comments: Oriented to self and place, not time.  At baseline     (all labs ordered are listed, but only abnormal results are displayed) Labs Reviewed  COMPREHENSIVE METABOLIC PANEL WITH GFR - Abnormal; Notable for the following components:      Result Value   Glucose, Bld 101 (*)    Creatinine, Ser 1.07 (*)    GFR, Estimated 55 (*)    All other components within normal limits  CBC WITH DIFFERENTIAL/PLATELET - Abnormal; Notable for the following components:   WBC 12.2 (*)    Neutro Abs 9.5 (*)     Abs Immature Granulocytes 0.08 (*)    All other components within normal limits  I-STAT CHEM 8, ED - Abnormal; Notable for the following components:   Creatinine, Ser 1.10 (*)    Glucose, Bld 101 (*)    Calcium, Ion 1.02 (*)    All other components within normal limits    EKG: EKG Interpretation Date/Time:  Sunday June 04 2024 07:02:55 EST Ventricular Rate:  60 PR Interval:  153 QRS Duration:  97 QT Interval:  477 QTC Calculation: 477 R Axis:   -60  Text Interpretation: Sinus rhythm Ventricular premature complex Probable left atrial enlargement Left anterior fascicular block Abnormal R-wave progression, late transition Borderline T abnormalities, anterior leads Confirmed by Patsey Lot [phone]) on 06/04/2024 8:37:58 AM  Radiology: CT CHEST ABDOMEN PELVIS W CONTRAST Result Date: 06/04/2024 EXAM: CT CHEST, ABDOMEN AND PELVIS WITH CONTRAST 06/04/2024 08:34:16 AM TECHNIQUE: CT of the chest, abdomen and pelvis was performed with the administration of 75 mL of iohexol  (OMNIPAQUE ) 350 MG/ML injection. Multiplanar reformatted images are provided for review. Automated exposure control, iterative reconstruction, and/or weight based adjustment of the mA/kV was utilized to reduce the radiation dose to as low as reasonably achievable. COMPARISON: None available. CLINICAL HISTORY: fall down 8 stairs. Fell down 8 stairs. FINDINGS: CHEST: MEDIASTINUM AND LYMPH NODES: Heart and pericardium are unremarkable. The central airways are clear. No mediastinal, hilar or axillary lymphadenopathy. There is a small sliding hiatus hernia. LUNGS AND PLEURA: Mild dependent atelectasis within the lower lobes bilaterally. No focal consolidation or pulmonary edema. No pleural effusion. No pneumothorax. ABDOMEN AND PELVIS: LIVER: Unremarkable. GALLBLADDER AND BILE DUCTS: Status post cholecystectomy. No biliary ductal dilatation. SPLEEN: No acute abnormality. PANCREAS: No acute abnormality. ADRENAL GLANDS: No acute  abnormality. KIDNEYS, URETERS AND BLADDER: Mild bilateral renal cortical atrophy. No stones in the kidneys or ureters. No hydronephrosis. No perinephric or periureteral stranding. Urinary bladder is unremarkable. GI AND BOWEL: Stomach demonstrates no acute abnormality. There is no bowel obstruction. REPRODUCTIVE ORGANS: No acute abnormality. PERITONEUM AND RETROPERITONEUM: No ascites. No free air. VASCULATURE: Aorta is normal in caliber. ABDOMINAL AND PELVIS LYMPH NODES: No lymphadenopathy. BONES AND SOFT TISSUES: No acute osseous abnormality. No focal soft tissue abnormality. There is no evidence of acute traumatic injury. IMPRESSION: 1. No evidence of acute traumatic injury. 2. Small sliding hiatal hernia. 3. Mild bilateral renal cortical atrophy. Electronically signed by: Evalene Coho MD 06/04/2024 09:14 AM EST RP Workstation: [id number]   CT Head Wo Contrast Result Date: 06/04/2024 EXAM: CT HEAD, FACIAL BONES AND CERVICAL SPINE WITHOUT CONTRAST 06/04/2024 08:34:16 AM  TECHNIQUE: CT of the head, facial bones and cervical spine was performed without the administration of intravenous contrast. Multiplanar reformatted images are provided for review. Automated exposure control, iterative reconstruction, and/or weight based adjustment of the mA/kV was utilized to reduce the radiation dose to as low as reasonably achievable. COMPARISON: Cervical spine CT from 06/05/2023 and brain MRI from 02/07/2023. CLINICAL HISTORY: Clemens downstairs. Hematoma and laceration to left forehead. FINDINGS: CT HEAD BRAIN AND VENTRICLES: Hypoattenuating foci in the cerebral white matter, most likely representing chronic small vessel disease. Prominence of the sulci and ventricles compatible with brain atrophy. No acute intracranial hemorrhage. No mass effect or midline shift. No extra-axial fluid collection. No evidence of acute infarct. No hydrocephalus. SKULL AND SCALP: There is a left frontal scalp laceration extending to the  hematoma to the level of the inferior maxilla, axial image 51/3. No acute skull fracture. CT FACIAL BONES FACIAL BONES: No acute facial fracture. No mandibular dislocation. No suspicious bone lesion. ORBITS: No acute traumatic injury. SINUSES AND MASTOIDS: Paranasal sinuses and mastoid air cells are clear. SOFT TISSUES: No acute abnormality. CT CERVICAL SPINE BONES AND ALIGNMENT: No acute posttraumatic malalignment of the cervical spine. The cervical vertebral body heights are maintained without signs of acute fracture or subluxation. DEGENERATIVE CHANGES: There is marked disc space narrowing and endplate degenerative changes noted at C5-C6. SOFT TISSUES: No prevertebral soft tissue swelling. IMPRESSION: 1. No acute intracranial abnormality. 2. Left frontal scalp laceration and left infraorbital and cheek hematoma. 3. No acute fracture of the facial bones. 4. No acute fracture or traumatic malalignment of the cervical spine. 5. Cervical degenerative disc disease. Electronically signed by: Waddell Calk MD 06/04/2024 09:10 AM EST RP Workstation: [id number]   CT Cervical Spine Wo Contrast Result Date: 06/04/2024 EXAM: CT HEAD, FACIAL BONES AND CERVICAL SPINE WITHOUT CONTRAST 06/04/2024 08:34:16 AM TECHNIQUE: CT of the head, facial bones and cervical spine was performed without the administration of intravenous contrast. Multiplanar reformatted images are provided for review. Automated exposure control, iterative reconstruction, and/or weight based adjustment of the mA/kV was utilized to reduce the radiation dose to as low as reasonably achievable. COMPARISON: Cervical spine CT from 06/05/2023 and brain MRI from 02/07/2023. CLINICAL HISTORY: Clemens downstairs. Hematoma and laceration to left forehead. FINDINGS: CT HEAD BRAIN AND VENTRICLES: Hypoattenuating foci in the cerebral white matter, most likely representing chronic small vessel disease. Prominence of the sulci and ventricles compatible with brain atrophy. No  acute intracranial hemorrhage. No mass effect or midline shift. No extra-axial fluid collection. No evidence of acute infarct. No hydrocephalus. SKULL AND SCALP: There is a left frontal scalp laceration extending to the hematoma to the level of the inferior maxilla, axial image 51/3. No acute skull fracture. CT FACIAL BONES FACIAL BONES: No acute facial fracture. No mandibular dislocation. No suspicious bone lesion. ORBITS: No acute traumatic injury. SINUSES AND MASTOIDS: Paranasal sinuses and mastoid air cells are clear. SOFT TISSUES: No acute abnormality. CT CERVICAL SPINE BONES AND ALIGNMENT: No acute posttraumatic malalignment of the cervical spine. The cervical vertebral body heights are maintained without signs of acute fracture or subluxation. DEGENERATIVE CHANGES: There is marked disc space narrowing and endplate degenerative changes noted at C5-C6. SOFT TISSUES: No prevertebral soft tissue swelling. IMPRESSION: 1. No acute intracranial abnormality. 2. Left frontal scalp laceration and left infraorbital and cheek hematoma. 3. No acute fracture of the facial bones. 4. No acute fracture or traumatic malalignment of the cervical spine. 5. Cervical degenerative disc disease. Electronically signed by: Waddell Calk MD  06/04/2024 09:10 AM EST RP Workstation: [id number]   CT Maxillofacial Wo Contrast Result Date: 06/04/2024 EXAM: CT HEAD, FACIAL BONES AND CERVICAL SPINE WITHOUT CONTRAST 06/04/2024 08:34:16 AM TECHNIQUE: CT of the head, facial bones and cervical spine was performed without the administration of intravenous contrast. Multiplanar reformatted images are provided for review. Automated exposure control, iterative reconstruction, and/or weight based adjustment of the mA/kV was utilized to reduce the radiation dose to as low as reasonably achievable. COMPARISON: Cervical spine CT from 06/05/2023 and brain MRI from 02/07/2023. CLINICAL HISTORY: Clemens downstairs. Hematoma and laceration to left forehead.  FINDINGS: CT HEAD BRAIN AND VENTRICLES: Hypoattenuating foci in the cerebral white matter, most likely representing chronic small vessel disease. Prominence of the sulci and ventricles compatible with brain atrophy. No acute intracranial hemorrhage. No mass effect or midline shift. No extra-axial fluid collection. No evidence of acute infarct. No hydrocephalus. SKULL AND SCALP: There is a left frontal scalp laceration extending to the hematoma to the level of the inferior maxilla, axial image 51/3. No acute skull fracture. CT FACIAL BONES FACIAL BONES: No acute facial fracture. No mandibular dislocation. No suspicious bone lesion. ORBITS: No acute traumatic injury. SINUSES AND MASTOIDS: Paranasal sinuses and mastoid air cells are clear. SOFT TISSUES: No acute abnormality. CT CERVICAL SPINE BONES AND ALIGNMENT: No acute posttraumatic malalignment of the cervical spine. The cervical vertebral body heights are maintained without signs of acute fracture or subluxation. DEGENERATIVE CHANGES: There is marked disc space narrowing and endplate degenerative changes noted at C5-C6. SOFT TISSUES: No prevertebral soft tissue swelling. IMPRESSION: 1. No acute intracranial abnormality. 2. Left frontal scalp laceration and left infraorbital and cheek hematoma. 3. No acute fracture of the facial bones. 4. No acute fracture or traumatic malalignment of the cervical spine. 5. Cervical degenerative disc disease. Electronically signed by: Waddell Calk MD 06/04/2024 09:10 AM EST RP Workstation: [id number]   DG Elbow Complete Right Result Date: 06/04/2024 EXAM: 3 VIEW(S) XRAY OF THE RIGHT ELBOW COMPARISON: None available. CLINICAL HISTORY: Fall down stairs; deformity noted. FINDINGS: BONES AND JOINTS: No acute fracture. No malalignment. SOFT TISSUES: Subcutaneous gas and soft tissue edema along the lateral elbow. IMPRESSION: 1. No acute fracture or dislocation. 2. Subcutaneous gas and soft tissue edema along the lateral elbow.  Electronically signed by: Waddell Calk MD 06/04/2024 08:16 AM EST RP Workstation: [id number]   DG Forearm Right Result Date: 06/04/2024 EXAM: VIEW(S) XRAY OF THE RIGHT FOREARM 06/04/2024 08:07:00 AM COMPARISON: None available. CLINICAL HISTORY: Fall down stairs; deformity noted. FINDINGS: FINDINGS: BONES AND JOINTS: Acute distal radial fracture with intra-articular extension, and minimally displaced ulnar styloid fracture. SOFT TISSUES: Soft tissue swelling about the wrist. IMPRESSION: 1. Acute distal radial fracture with intra-articular extension. 2. Minimally displaced ulnar styloid fracture. 3. Soft tissue swelling about the wrist. Electronically signed by: Waddell Calk MD 06/04/2024 08:15 AM EST RP Workstation: [id number]   DG Wrist Complete Right Result Date: 06/04/2024 EXAM: 3 or more VIEW(S) XRAY OF THE WRIST 06/04/2024 08:07:00 AM COMPARISON: None available. CLINICAL HISTORY: Fall down stairs; deformity noted. FINDINGS: BONES AND JOINTS: Acute transverse impacted fracture of distal radial metaphysis with intra-articular extension. Dorsal impaction. Nondisplaced ulnar styloid fracture. A small ossific density is identified dorsal to the carpal bones. Differential diagnosis for the ossific density includes an avulsion fragment, an old injury, or a sesamoid. SOFT TISSUES: Diffuse soft tissue swelling. IMPRESSION: 1. Acute impacted intra-articular distal radius fracture with dorsal impaction; orthopedic management recommended. 2. Nondisplaced ulnar styloid fracture. 3. Small dorsal carpal ossific fragment,  possibly an acute dorsal triquetral fracture; CT is recommended if this will affect management or if pain is disproportionate. Electronically signed by: Waddell Calk MD 06/04/2024 08:15 AM EST RP Workstation: [id number]     .Laceration Repair  Date/Time: 06/04/2024 9:10 AM  Performed by: Neysa Thersia RAMAN, PA-C Authorized by: Neysa Thersia RAMAN, PA-C   Consent:    Consent obtained:  Verbal    Consent given by:  Patient and guardian   Risks discussed:  Infection and need for additional repair Universal protocol:    Patient identity confirmed:  Verbally with patient Anesthesia:    Anesthesia method:  Local infiltration   Local anesthetic:  Lidocaine  2% WITH epi Laceration details:    Location:  Face   Face location:  Forehead   Length (cm):  6   Depth (mm):  2 Pre-procedure details:    Preparation:  Patient was prepped and draped in usual sterile fashion Exploration:    Hemostasis achieved with:  Direct pressure and epinephrine    Imaging outcome: foreign body not noted   Treatment:    Area cleansed with:  Povidone-iodine   Amount of cleaning:  Standard   Irrigation solution:  Sterile saline   Irrigation volume:  50cc   Irrigation method:  Syringe   Debridement:  None Skin repair:    Repair method:  Sutures   Suture size:  3-0   Suture material:  Prolene   Suture technique:  Simple interrupted   Number of sutures:  9 Approximation:    Approximation:  Close Repair type:    Repair type:  Simple Post-procedure details:    Dressing:  Open (no dressing)   Procedure completion:  Tolerated well, no immediate complications    Medications Ordered in the ED  lidocaine -EPINEPHrine  (XYLOCAINE  W/EPI) 2 %-1:200000 (PF) injection 20 mL (20 mLs Infiltration Given 06/04/24 0659)  LORazepam  (ATIVAN ) injection 0.5 mg (0.5 mg Intravenous Given 06/04/24 0732)  iohexol  (OMNIPAQUE ) 350 MG/ML injection 75 mL (75 mLs Intravenous Contrast Given 06/04/24 0834)    Clinical Course as of 06/04/24 1100  Sun Jun 04, 2024  1053 Consulted Dr. Celena with orthopedic surgery.  Advised he will repair radius fracture in OR today. [AY]    Clinical Course User Index [AY] Neysa Thersia RAMAN, PA-C                                Medical Decision Making Patient is a 74 year old female with a history of dementia who presents to the ED via EMS from home for a fall down the stairs earlier this morning.    husband notes he believes patient was going downstairs to get something to drink when she fell down approximately 6-8 carpeted stairs.  Please see detailed HPI above.  On exam patient is alert and in no acute distress.  Physical exam as noted above.  She does have a large laceration to the left forehead as well as surrounding hematoma.  EOM are intact and no abnormalities noted to the eye.  She does have an obvious deformity of the right forearm.  Full range of motion of bilateral lower extremities.  Thankfully patient is not on blood thinners.  She is unable to provide full history but is at baseline per husband.  Basic lab workup reassuring.  CT of the head, cervical spine, chest abdomen pelvis reviewed that are negative for acute fracture or traumatic bleed/injury.  X-ray of the right wrist does show a  impacted distal radius fracture with intra-articular extension.  Dr. Celena on-call with hand surgery was consulted.  He reviewed imaging and did advise that this would need repair.  He was able to contact the OR and will repair wrist fracture today.  Patient will go to the OR from the ED.  She is n.p.o. at this time, last oral intake 9 PM last evening.  She is currently in a short arm volar wrist splint.  The laceration to the forehead was cleaned thoroughly and repaired.  She does have some skin tears to the right elbow and left forearm that were wrapped in gauze.  Wound care instructions discussed with husband and son for cleaning the area as well as follow-up for removal.  They verbalized understanding.  Patient is stable while in ED and awaiting transfer to the OR.  Amount and/or Complexity of Data Reviewed Labs: ordered. Radiology: ordered.  Risk Prescription drug management. Decision regarding hospitalization.       Final diagnoses:  Fall, initial encounter  Injury of head, initial encounter  Laceration of eyebrow and forehead, left, initial encounter  Facial hematoma, initial encounter   Other closed intra-articular fracture of distal end of right radius, initial encounter    ED Discharge Orders     None          Neysa Thersia GORMAN DEVONNA 06/04/24 1103    Patsey Lot, MD 06/04/24 1453  "

## 2024-06-04 NOTE — Progress Notes (Signed)
 Pt. Arrived to short stay with laceration above the left eyebrow. Sutures in place. Facial swelling on left eye with bruising.

## 2024-06-04 NOTE — H&P (Signed)
 "                    [facility] H&P  Patient ID: Megan Schultz MRN: [id number] DOB/AGE: [id number] y.o.  Chief Complaint: Right distal radius fracture HPI: Megan Schultz is an 74 y.o. female. RHD, fall down stairs with forehead laceration and distal radius. Pain is  moderately well controlled now, aching and dull, sharp and severe with motion, without associated distal tingling or numbness, and improved with narcotics.    Past Medical History:  Diagnosis Date   ADD (attention deficit disorder)    High cholesterol    Major depression in partial remission    Memory changes    Muscle spasm     Past Surgical History:  Procedure Laterality Date   CHOLECYSTECTOMY     FOOT SURGERY Right    SHOULDER ARTHROSCOPY      Family History  Problem Relation Age of Onset   Dementia Mother    Cancer Father        gall bladder   Other Maternal Grandmother        syringomyelia   Alcoholism Paternal Grandfather    Breast cancer Neg Hx    Social History:  reports that she has never smoked. She has never used smokeless tobacco. She reports that she does not currently use alcohol. She reports that she does not use drugs.  Allergies: Allergies[1]  (Not in a hospital admission)   Results for orders placed or performed during the hospital encounter of 06/04/24 (from the past 48 hours)  Comprehensive metabolic panel     Status: Abnormal   Collection Time: 06/04/24  6:42 AM  Result Value Ref Range   Sodium 138 135 - 145 mmol/L   Potassium 4.6 3.5 - 5.1 mmol/L    Comment: HEMOLYSIS AT THIS LEVEL MAY AFFECT RESULT   Chloride 103 98 - 111 mmol/L   CO2 24 22 - 32 mmol/L   Glucose, Bld 101 (H) 70 - 99 mg/dL    Comment: Glucose reference range applies only to samples taken after fasting for at least 8 hours.   BUN 19 8 - 23 mg/dL   Creatinine, Ser 8.92 (H) 0.44 - 1.00 mg/dL   Calcium 9.4 8.9 - 89.6 mg/dL   Total Protein 6.7 6.5 - 8.1 g/dL   Albumin 3.9 3.5 - 5.0 g/dL    AST 41 15 - 41 U/L    Comment: HEMOLYSIS AT THIS LEVEL MAY AFFECT RESULT   ALT 25 0 - 44 U/L   Alkaline Phosphatase 82 38 - 126 U/L   Total Bilirubin 0.3 0.0 - 1.2 mg/dL   GFR, Estimated 55 (L) >60 mL/min    Comment: (NOTE) Calculated using the CKD-EPI Creatinine Equation (2021)    Anion gap 11 5 - 15    Comment: Performed at [facility], [address] [place], [place] [address]  CBC with Differential     Status: Abnormal   Collection Time: 06/04/24  6:42 AM  Result Value Ref Range   WBC 12.2 (H) 4.0 - 10.5 K/uL   RBC 4.55 3.87 - 5.11 MIL/uL   Hemoglobin 14.6 12.0 - 15.0 g/dL   HCT 58.6 63.9 - 53.9 %   MCV 90.8 80.0 - 100.0 fL   MCH 32.1 26.0 - 34.0 pg   MCHC 35.4 30.0 - 36.0 g/dL   RDW 87.4 88.4 - 84.4 %   Platelets 212 150 - 400 K/uL   nRBC 0.0  0.0 - 0.2 %   Neutrophils Relative % 77 %   Neutro Abs 9.5 (H) 1.7 - 7.7 K/uL   Lymphocytes Relative 15 %   Lymphs Abs 1.8 0.7 - 4.0 K/uL   Monocytes Relative 6 %   Monocytes Absolute 0.7 0.1 - 1.0 K/uL   Eosinophils Relative 1 %   Eosinophils Absolute 0.1 0.0 - 0.5 K/uL   Basophils Relative 0 %   Basophils Absolute 0.0 0.0 - 0.1 K/uL   Immature Granulocytes 1 %   Abs Immature Granulocytes 0.08 (H) 0.00 - 0.07 K/uL    Comment: Performed at [facility], [address], [place], [place] [address]  I-stat chem 8, ED     Status: Abnormal   Collection Time: 06/04/24  7:01 AM  Result Value Ref Range   Sodium 137 135 - 145 mmol/L   Potassium 4.2 3.5 - 5.1 mmol/L   Chloride 103 98 - 111 mmol/L   BUN 21 8 - 23 mg/dL   Creatinine, Ser 8.89 (H) 0.44 - 1.00 mg/dL   Glucose, Bld 898 (H) 70 - 99 mg/dL    Comment: Glucose reference range applies only to samples taken after fasting for at least 8 hours.   Calcium, Ion 1.02 (L) 1.15 - 1.40 mmol/L   TCO2 25 22 - 32 mmol/L   Hemoglobin 13.9 12.0 - 15.0 g/dL   HCT 58.9 63.9 - 53.9 %   CT CHEST ABDOMEN PELVIS W CONTRAST Result Date: 06/04/2024 EXAM: CT CHEST, ABDOMEN AND  PELVIS WITH CONTRAST 06/04/2024 08:34:16 AM TECHNIQUE: CT of the chest, abdomen and pelvis was performed with the administration of 75 mL of iohexol  (OMNIPAQUE ) 350 MG/ML injection. Multiplanar reformatted images are provided for review. Automated exposure control, iterative reconstruction, and/or weight based adjustment of the mA/kV was utilized to reduce the radiation dose to as low as reasonably achievable. COMPARISON: None available. CLINICAL HISTORY: fall down 8 stairs. Fell down 8 stairs. FINDINGS: CHEST: MEDIASTINUM AND LYMPH NODES: Heart and pericardium are unremarkable. The central airways are clear. No mediastinal, hilar or axillary lymphadenopathy. There is a small sliding hiatus hernia. LUNGS AND PLEURA: Mild dependent atelectasis within the lower lobes bilaterally. No focal consolidation or pulmonary edema. No pleural effusion. No pneumothorax. ABDOMEN AND PELVIS: LIVER: Unremarkable. GALLBLADDER AND BILE DUCTS: Status post cholecystectomy. No biliary ductal dilatation. SPLEEN: No acute abnormality. PANCREAS: No acute abnormality. ADRENAL GLANDS: No acute abnormality. KIDNEYS, URETERS AND BLADDER: Mild bilateral renal cortical atrophy. No stones in the kidneys or ureters. No hydronephrosis. No perinephric or periureteral stranding. Urinary bladder is unremarkable. GI AND BOWEL: Stomach demonstrates no acute abnormality. There is no bowel obstruction. REPRODUCTIVE ORGANS: No acute abnormality. PERITONEUM AND RETROPERITONEUM: No ascites. No free air. VASCULATURE: Aorta is normal in caliber. ABDOMINAL AND PELVIS LYMPH NODES: No lymphadenopathy. BONES AND SOFT TISSUES: No acute osseous abnormality. No focal soft tissue abnormality. There is no evidence of acute traumatic injury. IMPRESSION: 1. No evidence of acute traumatic injury. 2. Small sliding hiatal hernia. 3. Mild bilateral renal cortical atrophy. Electronically signed by: Evalene Coho MD 06/04/2024 09:14 AM EST RP Workstation: [id number]   CT  Head Wo Contrast Result Date: 06/04/2024 EXAM: CT HEAD, FACIAL BONES AND CERVICAL SPINE WITHOUT CONTRAST 06/04/2024 08:34:16 AM TECHNIQUE: CT of the head, facial bones and cervical spine was performed without the administration of intravenous contrast. Multiplanar reformatted images are provided for review. Automated exposure control, iterative reconstruction, and/or weight based adjustment of the mA/kV was utilized to reduce the radiation dose  to as low as reasonably achievable. COMPARISON: Cervical spine CT from 06/05/2023 and brain MRI from 02/07/2023. CLINICAL HISTORY: Megan Schultz downstairs. Hematoma and laceration to left forehead. FINDINGS: CT HEAD BRAIN AND VENTRICLES: Hypoattenuating foci in the cerebral white matter, most likely representing chronic small vessel disease. Prominence of the sulci and ventricles compatible with brain atrophy. No acute intracranial hemorrhage. No mass effect or midline shift. No extra-axial fluid collection. No evidence of acute infarct. No hydrocephalus. SKULL AND SCALP: There is a left frontal scalp laceration extending to the hematoma to the level of the inferior maxilla, axial image 51/3. No acute skull fracture. CT FACIAL BONES FACIAL BONES: No acute facial fracture. No mandibular dislocation. No suspicious bone lesion. ORBITS: No acute traumatic injury. SINUSES AND MASTOIDS: Paranasal sinuses and mastoid air cells are clear. SOFT TISSUES: No acute abnormality. CT CERVICAL SPINE BONES AND ALIGNMENT: No acute posttraumatic malalignment of the cervical spine. The cervical vertebral body heights are maintained without signs of acute fracture or subluxation. DEGENERATIVE CHANGES: There is marked disc space narrowing and endplate degenerative changes noted at C5-C6. SOFT TISSUES: No prevertebral soft tissue swelling. IMPRESSION: 1. No acute intracranial abnormality. 2. Left frontal scalp laceration and left infraorbital and cheek hematoma. 3. No acute fracture of the facial bones. 4.  No acute fracture or traumatic malalignment of the cervical spine. 5. Cervical degenerative disc disease. Electronically signed by: Waddell Calk MD 06/04/2024 09:10 AM EST RP Workstation: [id number]   CT Cervical Spine Wo Contrast Result Date: 06/04/2024 EXAM: CT HEAD, FACIAL BONES AND CERVICAL SPINE WITHOUT CONTRAST 06/04/2024 08:34:16 AM TECHNIQUE: CT of the head, facial bones and cervical spine was performed without the administration of intravenous contrast. Multiplanar reformatted images are provided for review. Automated exposure control, iterative reconstruction, and/or weight based adjustment of the mA/kV was utilized to reduce the radiation dose to as low as reasonably achievable. COMPARISON: Cervical spine CT from 06/05/2023 and brain MRI from 02/07/2023. CLINICAL HISTORY: Megan Schultz downstairs. Hematoma and laceration to left forehead. FINDINGS: CT HEAD BRAIN AND VENTRICLES: Hypoattenuating foci in the cerebral white matter, most likely representing chronic small vessel disease. Prominence of the sulci and ventricles compatible with brain atrophy. No acute intracranial hemorrhage. No mass effect or midline shift. No extra-axial fluid collection. No evidence of acute infarct. No hydrocephalus. SKULL AND SCALP: There is a left frontal scalp laceration extending to the hematoma to the level of the inferior maxilla, axial image 51/3. No acute skull fracture. CT FACIAL BONES FACIAL BONES: No acute facial fracture. No mandibular dislocation. No suspicious bone lesion. ORBITS: No acute traumatic injury. SINUSES AND MASTOIDS: Paranasal sinuses and mastoid air cells are clear. SOFT TISSUES: No acute abnormality. CT CERVICAL SPINE BONES AND ALIGNMENT: No acute posttraumatic malalignment of the cervical spine. The cervical vertebral body heights are maintained without signs of acute fracture or subluxation. DEGENERATIVE CHANGES: There is marked disc space narrowing and endplate degenerative changes noted at C5-C6. SOFT  TISSUES: No prevertebral soft tissue swelling. IMPRESSION: 1. No acute intracranial abnormality. 2. Left frontal scalp laceration and left infraorbital and cheek hematoma. 3. No acute fracture of the facial bones. 4. No acute fracture or traumatic malalignment of the cervical spine. 5. Cervical degenerative disc disease. Electronically signed by: Waddell Calk MD 06/04/2024 09:10 AM EST RP Workstation: [id number]   CT Maxillofacial Wo Contrast Result Date: 06/04/2024 EXAM: CT HEAD, FACIAL BONES AND CERVICAL SPINE WITHOUT CONTRAST 06/04/2024 08:34:16 AM TECHNIQUE: CT of the head, facial bones and cervical spine was performed without the administration  of intravenous contrast. Multiplanar reformatted images are provided for review. Automated exposure control, iterative reconstruction, and/or weight based adjustment of the mA/kV was utilized to reduce the radiation dose to as low as reasonably achievable. COMPARISON: Cervical spine CT from 06/05/2023 and brain MRI from 02/07/2023. CLINICAL HISTORY: Megan Schultz downstairs. Hematoma and laceration to left forehead. FINDINGS: CT HEAD BRAIN AND VENTRICLES: Hypoattenuating foci in the cerebral white matter, most likely representing chronic small vessel disease. Prominence of the sulci and ventricles compatible with brain atrophy. No acute intracranial hemorrhage. No mass effect or midline shift. No extra-axial fluid collection. No evidence of acute infarct. No hydrocephalus. SKULL AND SCALP: There is a left frontal scalp laceration extending to the hematoma to the level of the inferior maxilla, axial image 51/3. No acute skull fracture. CT FACIAL BONES FACIAL BONES: No acute facial fracture. No mandibular dislocation. No suspicious bone lesion. ORBITS: No acute traumatic injury. SINUSES AND MASTOIDS: Paranasal sinuses and mastoid air cells are clear. SOFT TISSUES: No acute abnormality. CT CERVICAL SPINE BONES AND ALIGNMENT: No acute posttraumatic malalignment of the cervical  spine. The cervical vertebral body heights are maintained without signs of acute fracture or subluxation. DEGENERATIVE CHANGES: There is marked disc space narrowing and endplate degenerative changes noted at C5-C6. SOFT TISSUES: No prevertebral soft tissue swelling. IMPRESSION: 1. No acute intracranial abnormality. 2. Left frontal scalp laceration and left infraorbital and cheek hematoma. 3. No acute fracture of the facial bones. 4. No acute fracture or traumatic malalignment of the cervical spine. 5. Cervical degenerative disc disease. Electronically signed by: Waddell Calk MD 06/04/2024 09:10 AM EST RP Workstation: [id number]   DG Elbow Complete Right Result Date: 06/04/2024 EXAM: 3 VIEW(S) XRAY OF THE RIGHT ELBOW COMPARISON: None available. CLINICAL HISTORY: Fall down stairs; deformity noted. FINDINGS: BONES AND JOINTS: No acute fracture. No malalignment. SOFT TISSUES: Subcutaneous gas and soft tissue edema along the lateral elbow. IMPRESSION: 1. No acute fracture or dislocation. 2. Subcutaneous gas and soft tissue edema along the lateral elbow. Electronically signed by: Waddell Calk MD 06/04/2024 08:16 AM EST RP Workstation: [id number]   DG Forearm Right Result Date: 06/04/2024 EXAM: VIEW(S) XRAY OF THE RIGHT FOREARM 06/04/2024 08:07:00 AM COMPARISON: None available. CLINICAL HISTORY: Fall down stairs; deformity noted. FINDINGS: FINDINGS: BONES AND JOINTS: Acute distal radial fracture with intra-articular extension, and minimally displaced ulnar styloid fracture. SOFT TISSUES: Soft tissue swelling about the wrist. IMPRESSION: 1. Acute distal radial fracture with intra-articular extension. 2. Minimally displaced ulnar styloid fracture. 3. Soft tissue swelling about the wrist. Electronically signed by: Waddell Calk MD 06/04/2024 08:15 AM EST RP Workstation: [id number]   DG Wrist Complete Right Result Date: 06/04/2024 EXAM: 3 or more VIEW(S) XRAY OF THE WRIST 06/04/2024 08:07:00 AM COMPARISON: None  available. CLINICAL HISTORY: Fall down stairs; deformity noted. FINDINGS: BONES AND JOINTS: Acute transverse impacted fracture of distal radial metaphysis with intra-articular extension. Dorsal impaction. Nondisplaced ulnar styloid fracture. A small ossific density is identified dorsal to the carpal bones. Differential diagnosis for the ossific density includes an avulsion fragment, an old injury, or a sesamoid. SOFT TISSUES: Diffuse soft tissue swelling. IMPRESSION: 1. Acute impacted intra-articular distal radius fracture with dorsal impaction; orthopedic management recommended. 2. Nondisplaced ulnar styloid fracture. 3. Small dorsal carpal ossific fragment, possibly an acute dorsal triquetral fracture; CT is recommended if this will affect management or if pain is disproportionate. Electronically signed by: Waddell Calk MD 06/04/2024 08:15 AM EST RP Workstation: [id number]    ROS No recent fever, bleeding abnormalities, urologic dysfunction, GI  problems, or weight gain.   Blood pressure 127/73, pulse 61, temperature 97.7 F (36.5 C), temperature source Oral, resp. rate 17, weight 79 kg, SpO2 100%. Physical Exam Traumatic head wound left forehead RUEx   Splint in place  Sens  Ax/R/M/U intact  Mot   Ax/ R/ PIN/ M/ AIN/ U intact  Brisk CR   Assessment/Plan  Right distal radius fracture Fall down the stairs  The risks and benefits of right distal radius repari were discussed with the patient's son, including the possibility of infection, nerve injury, vessel injury, wound breakdown, arthritis, symptomatic hardware, DVT/ PE, loss of motion, malunion, nonunion, and need for further surgery among others.  We also specifically discussed the option of nonsurgical management and risk of malunion.  These risks were acknowledged and consent was provided to proceed.   Ozell Bruch, MD [facility], [facility] [phone]  06/04/2024, 11:08 AM  [facility] [address] [place] [address] [phone] MAXIMINO MILLING (F)        [1]  Allergies Allergen Reactions   Amoxicillin Itching   "

## 2024-06-04 NOTE — Anesthesia Preprocedure Evaluation (Addendum)
"                                    Anesthesia Evaluation  Patient identified by MRN, date of birth, ID band Patient awake and Patient confused    Reviewed: Allergy & Precautions, NPO status , Patient's Chart, lab work & pertinent test results  Airway Mallampati: II  TM Distance: >3 FB Neck ROM: Full    Dental  (+) Teeth Intact, Dental Advisory Given   Pulmonary neg pulmonary ROS   Pulmonary exam normal breath sounds clear to auscultation       Cardiovascular negative cardio ROS Normal cardiovascular exam Rhythm:Regular Rate:Normal     Neuro/Psych  PSYCHIATRIC DISORDERS  Depression   Dementia negative neurological ROS     GI/Hepatic negative GI ROS, Neg liver ROS,,,  Endo/Other  negative endocrine ROS    Renal/GU negative Renal ROS     Musculoskeletal RIGHT DISTAL RADIUS FRACTURE   Abdominal   Peds  (+) ATTENTION DEFICIT DISORDER WITHOUT HYPERACTIVITY Hematology negative hematology ROS (+)   Anesthesia Other Findings Day of surgery medications reviewed with the patient.  Reproductive/Obstetrics                              Anesthesia Physical Anesthesia Plan  ASA: 3 and emergent  Anesthesia Plan: General   Post-op Pain Management: Ofirmev  IV (intra-op)* and Toradol  IV (intra-op)*   Induction: Intravenous  PONV Risk Score and Plan: 3 and Dexamethasone  and Ondansetron   Airway Management Planned: LMA  Additional Equipment:   Intra-op Plan:   Post-operative Plan: Extubation in OR  Informed Consent: I have reviewed the patients History and Physical, chart, labs and discussed the procedure including the risks, benefits and alternatives for the proposed anesthesia with the patient or authorized representative who has indicated his/her understanding and acceptance.     Dental advisory given and Consent reviewed with POA  Plan Discussed with: CRNA  Anesthesia Plan Comments:          Anesthesia Quick  Evaluation  "

## 2024-06-04 NOTE — ED Notes (Signed)
 Pt agitated and getting out of bed. PA notified.

## 2024-06-04 NOTE — Anesthesia Procedure Notes (Signed)
 Procedure Name: LMA Insertion Date/Time: 06/04/2024 12:25 PM  Performed by: Bonny Avelina Caldron, CRNAPre-anesthesia Checklist: Patient identified, Emergency Drugs available, Suction available, Patient being monitored and Timeout performed Patient Re-evaluated:Patient Re-evaluated prior to induction Oxygen Delivery Method: Circle system utilized Preoxygenation: Pre-oxygenation with 100% oxygen Induction Type: IV induction LMA: LMA inserted LMA Size: 4.0 Number of attempts: 1 Tube secured with: Tape

## 2024-06-04 NOTE — Op Note (Addendum)
 OPERATIVE REPORT  Megan Schultz [id number]  DATE OF PROCEDURE:  06/04/2024  PREOPERATIVE DIAGNOSES:   1. RIGHT DISTAL RADIUS FRACTURE WITH ANGULATION AND INTRA-ARTICULAR EXTENSION with two fragments. 2. Right ulnar styloid fracture. 3. Right elbow wound.  POSTOPERATIVE DIAGNOSES:   1. RIGHT DISTAL RADIUS FRACTURE WITH ANGULATION AND INTRA-ARTICULAR EXTENSION with three fragments. 2. Right ulnar styloid fracture. 3. Right elbow wound.  PROCEDURE:   1. OPEN REDUCTION INTERNAL FIXATION OF RIGHT DISTAL RADIUS. 2. DEBRIDEMENT and IRRIGATION OF OPEN WOUND INCLUDING SKIN, SUBCUTANEOUS TISSUE, AND FASCIA 3. LAYERED CLOSURE 3 CM.  SURGEON:  Ozell Bruch, MD  ASSISTANT:  None.  ANESTHESIA:  General.  COMPLICATIONS:  None.  TOURNIQUET:  None.  DISPOSITION:  To PACU.  CONDITION:  Stable.  BRIEF SUMMARY AND INDICATIONS FOR PROCEDURE:  The patient is a very pleasant 74 y.o. with dementia who sustained a distal radius fracture in a fall down the stairs. Because of the angulation and displacement in addition to other factors, I recommended internal fixation. I discussed with the patient's husband and son the risks and benefits of surgery including the possibility of a stroke,  heart attack, infection, nerve injury, vessel injury, infection, DVT, PE, failure to regain motion, arthritis and multiple others and she strongly wished to proceed.   SUMMARY OF PROCEDURE:  The patient was taken to the operating room where general anesthesia was induced.  The operative upper extremity was prepped and draped in the usual sterile fashion.  No tourniquet was used during the procedure.  After a timeout, standard volar approach was made to the distal radius.  The deep aspect of the flexor carpi radialis tendon sheath was incised and then the tendon and radial artery retracted radially for protection.  The radial edge of the pronator was incised and the muscle belly swept ulnarly with a Bennett retractor  carefully placed there. I did place 2 towels underneath the carpus to help restore tilt and this helped with the lunate fossa fracture line, but it was quite difficult to restore full articular congruency and alignment and so a scrub assistant was necessary to obtain and maintain reduction with traction in addition to retracting. There was an additional fracture line with displacement adjacent to the radial styloid. Because of the extension of the fracture lines into the articular surface producing three separate articular segments in addition to the shaft, I placed Hohmann retractors along both the radial aspect and the ulnar aspects of the distal radius to compress the articular surface of these fragments together. I placed pins through the plate into the subchondral area and checked them for position. I followed with the distal row of pegs with the plate flexed volarly off the metaphysis. As I brought the plate down to the bone, appropriate tilt, inclination and height were restored. The ulna reduced quite nicely and as a result did not require additional fixation.  Shuck test of the DRUJ did not suggest instability that would require fixation.  Wound was irrigated thoroughly and then the wound closed in standard layered fashion using 0 Vicryl, 2-0 Vicryl and 3-0 nylon for the skin.    Attention was then turned to the right elbow wound which consisted of 3 cm skin wound in midst of 6 cm x 2 cm abrasion. Exploration revealed it to extend 6 cm between subcu and fascia but not into the joint. I sharply debrided devitalized skin and subcutaneous tissus with the Metzenbaum scissors 3 cm x 2 cm by 2 cm deep and then at  the deep level used the scissors again to debride some fascia 1 cm x 2 cm. It was then irrigated aggressively with saline. A loose layered closure was performed with 2-0 PDS and interrupted simple 2-0 nylon. Sterile gently compressive dressing was applied over all wounds and then a volar splint.  The  patient was awakened from the anesthesia and transported to PACU in stable condition.  Again an assistant was necessary for successful, safe, and expedient completion of the case.  PROGNOSIS:  The patient will be in a sling with ice, elevation (hand above the elbow and elbow above the heart), and unrestricted range of motion of the digits and elbow.  Return to the office in 10--14 days for removal of sutures and conversion into a cast or removal splint depending on caregiver preference.  Dressing at the elbow may be reinforced or changed if necessary. 7 days of Duricef prophylaxis.

## 2024-06-05 ENCOUNTER — Encounter (HOSPITAL_COMMUNITY): Payer: Self-pay | Admitting: Orthopedic Surgery
